# Patient Record
Sex: Female | Born: 1990 | Race: Black or African American | Hispanic: No | Marital: Single | Smoking: Former smoker
Health system: Southern US, Community
[De-identification: ages and names within clinical notes are randomized; demographics above are authoritative.]

## PROBLEM LIST (undated history)

## (undated) DIAGNOSIS — J020 Streptococcal pharyngitis: Secondary | ICD-10-CM

## (undated) DIAGNOSIS — J45909 Unspecified asthma, uncomplicated: Secondary | ICD-10-CM

---
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
## 2011-03-18 ENCOUNTER — Emergency Department (HOSPITAL_COMMUNITY)
Admission: EM | Admit: 2011-03-18 | Discharge: 2011-03-18 | Disposition: A | Discharge status: Home / Self Care | Payer: Self-pay | Attending: Emergency Medicine | Admitting: Emergency Medicine

## 2011-03-18 ENCOUNTER — Emergency Department (HOSPITAL_COMMUNITY): Payer: Self-pay

## 2011-03-18 DIAGNOSIS — R0989 Other specified symptoms and signs involving the circulatory and respiratory systems: Secondary | ICD-10-CM | POA: Insufficient documentation

## 2011-03-18 DIAGNOSIS — R0609 Other forms of dyspnea: Secondary | ICD-10-CM | POA: Insufficient documentation

## 2011-03-18 DIAGNOSIS — J45901 Unspecified asthma with (acute) exacerbation: Secondary | ICD-10-CM | POA: Insufficient documentation

## 2011-04-05 LAB — POCT RAPID STREP A: Streptococcus, Group A Screen (Direct): NEGATIVE

## 2011-04-09 LAB — URINE MICROSCOPIC-ADD ON

## 2011-04-09 LAB — URINALYSIS, ROUTINE W REFLEX MICROSCOPIC
Hgb urine dipstick: NEGATIVE
Nitrite: NEGATIVE
Urobilinogen, UA: 1
pH: 7.5

## 2011-04-09 LAB — PREGNANCY, URINE: Preg Test, Ur: NEGATIVE

## 2012-01-31 ENCOUNTER — Emergency Department (HOSPITAL_COMMUNITY)
Admission: EM | Admit: 2012-01-31 | Discharge: 2012-02-01 | Disposition: A | Discharge status: Home / Self Care | Payer: PRIVATE HEALTH INSURANCE | Attending: Emergency Medicine | Admitting: Emergency Medicine

## 2012-01-31 ENCOUNTER — Encounter (HOSPITAL_COMMUNITY): Payer: Self-pay | Admitting: *Deleted

## 2012-01-31 ENCOUNTER — Emergency Department (INDEPENDENT_AMBULATORY_CARE_PROVIDER_SITE_OTHER)
Admission: EM | Admit: 2012-01-31 | Discharge: 2012-01-31 | Disposition: A | Discharge status: Nursing Home (Includes ICF) | Payer: PRIVATE HEALTH INSURANCE | Source: Home / Self Care | Attending: Emergency Medicine | Admitting: Emergency Medicine

## 2012-01-31 ENCOUNTER — Emergency Department (HOSPITAL_COMMUNITY): Payer: PRIVATE HEALTH INSURANCE

## 2012-01-31 DIAGNOSIS — S0510XA Contusion of eyeball and orbital tissues, unspecified eye, initial encounter: Secondary | ICD-10-CM | POA: Insufficient documentation

## 2012-01-31 DIAGNOSIS — S0083XA Contusion of other part of head, initial encounter: Secondary | ICD-10-CM

## 2012-01-31 DIAGNOSIS — J45909 Unspecified asthma, uncomplicated: Secondary | ICD-10-CM | POA: Insufficient documentation

## 2012-01-31 DIAGNOSIS — H1131 Conjunctival hemorrhage, right eye: Secondary | ICD-10-CM

## 2012-01-31 DIAGNOSIS — S0003XA Contusion of scalp, initial encounter: Secondary | ICD-10-CM | POA: Insufficient documentation

## 2012-01-31 DIAGNOSIS — H1130 Conjunctival hemorrhage, unspecified eye: Secondary | ICD-10-CM | POA: Insufficient documentation

## 2012-01-31 DIAGNOSIS — Y92009 Unspecified place in unspecified non-institutional (private) residence as the place of occurrence of the external cause: Secondary | ICD-10-CM | POA: Insufficient documentation

## 2012-01-31 DIAGNOSIS — S0993XA Unspecified injury of face, initial encounter: Secondary | ICD-10-CM

## 2012-01-31 HISTORY — DX: Unspecified asthma, uncomplicated: J45.909

## 2012-01-31 MED ORDER — OXYCODONE-ACETAMINOPHEN 5-325 MG PO TABS
2.0000 | ORAL_TABLET | Freq: Once | ORAL | Status: AC
  Administered 2012-01-31: 2 via ORAL
  Filled 2012-01-31: qty 2

## 2012-01-31 NOTE — ED Notes (Signed)
Pt awaiting transfer to [facility]

## 2012-01-31 NOTE — ED Notes (Signed)
Pt returned from CT °
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
## 2012-02-01 MED ORDER — OXYCODONE-ACETAMINOPHEN 5-325 MG PO TABS: 1.0000 | ORAL_TABLET | Freq: Four times a day (QID) | ORAL | Status: AC | PRN

## 2012-02-04 NOTE — ED Provider Notes (Signed)
Medical screening examination/treatment/procedure(s) were performed by non-physician practitioner and as supervising physician I was immediately available for consultation/collaboration.   Lorene Samaan Y. Sherae Santino, MD 02/04/12 1721 

## 2012-02-05 ENCOUNTER — Emergency Department (HOSPITAL_COMMUNITY)
Admission: EM | Admit: 2012-02-05 | Discharge: 2012-02-05 | Disposition: A | Discharge status: Home / Self Care | Payer: PRIVATE HEALTH INSURANCE | Attending: Emergency Medicine | Admitting: Emergency Medicine

## 2012-02-05 DIAGNOSIS — R51 Headache: Secondary | ICD-10-CM | POA: Insufficient documentation
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
## 2012-02-05 NOTE — ED Notes (Signed)
Patient discharge to home with written and verbal instructions. Respirations equal and unlabored, Skin warm and dry. No acute distress noted.

## 2012-11-14 ENCOUNTER — Other Ambulatory Visit: Payer: Self-pay

## 2012-11-14 ENCOUNTER — Emergency Department (HOSPITAL_COMMUNITY): Payer: Self-pay

## 2012-11-14 ENCOUNTER — Emergency Department (HOSPITAL_COMMUNITY)
Admission: EM | Admit: 2012-11-14 | Discharge: 2012-11-15 | Disposition: A | Discharge status: Home / Self Care | Payer: Self-pay | Attending: Emergency Medicine | Admitting: Emergency Medicine

## 2012-11-14 DIAGNOSIS — R05 Cough: Secondary | ICD-10-CM | POA: Insufficient documentation

## 2012-11-14 DIAGNOSIS — J019 Acute sinusitis, unspecified: Secondary | ICD-10-CM | POA: Insufficient documentation

## 2012-11-14 DIAGNOSIS — J069 Acute upper respiratory infection, unspecified: Secondary | ICD-10-CM | POA: Insufficient documentation

## 2012-11-14 DIAGNOSIS — R509 Fever, unspecified: Secondary | ICD-10-CM | POA: Insufficient documentation

## 2012-11-14 DIAGNOSIS — J3489 Other specified disorders of nose and nasal sinuses: Secondary | ICD-10-CM | POA: Insufficient documentation

## 2012-11-14 DIAGNOSIS — R059 Cough, unspecified: Secondary | ICD-10-CM | POA: Insufficient documentation

## 2012-11-14 DIAGNOSIS — J45909 Unspecified asthma, uncomplicated: Secondary | ICD-10-CM | POA: Insufficient documentation

## 2012-11-14 NOTE — ED Provider Notes (Signed)
History    This chart was scribed for Magnus Sinning, PA working with Gerhard Munch, MD by ED Scribe, Burman Nieves. This patient was seen in room WTR5/WTR5 and the patient's care was started at 10:42 PM.   CSN: [id number]  Arrival date & time 11/14/12  2212   First MD Initiated Contact with Patient 11/14/12 2242      Chief Complaint  Patient presents with  . Hoarse    (Consider location/radiation/quality/duration/timing/severity/associated sxs/prior treatment) The history is provided by the patient. No language interpreter was used.   HPI Comments: Wendy Morton is a 22 y.o. female who presents to the Emergency Department complaining of moderate constant sore throat, nasal congestion,productive cough, and sinus pressure which started a week ago but has progressively gotten worse today. She states she has taken alka seltzer with no immediate relief. Pt also has had some moderate constant chest tightness for the past week.  Tightness primarily with coughing.  She denies SOB or wheezing.   Pt states that she had a fever 101 today. Pt denies  nausea, vomiting, diarrhea, and any other associated symptoms.  She does not smoke.  She does have a history of Asthma.    Past Medical History  Diagnosis Date  . Asthma     No past surgical history on file.  No family history on file.  History  Substance Use Topics  . Smoking status: Never Smoker   . Smokeless tobacco: Never Used  . Alcohol Use: Yes     Comment: occasional per pt    OB History   Grav Para Term Preterm Abortions TAB SAB Ect Mult Living                  Review of Systems  HENT: Positive for sore throat and voice change.   All other systems reviewed and are negative.    Allergies  Review of patient's allergies indicates no known allergies.  Home Medications   Current Outpatient Rx  Name  Route  Sig  Dispense  Refill  . Chlorphen-Phenyleph-ASA (ALKA-SELTZER PLUS COLD PO)   Oral   Take 1 capsule by mouth  daily.           BP 122/76  Pulse 90  Temp(Src) 98.2 F (36.8 C) (Oral)  Resp 15  SpO2 99%  Physical Exam  Nursing note and vitals reviewed. Constitutional: She appears well-developed and well-nourished.  HENT:  Head: Normocephalic and atraumatic. No trismus in the jaw.  Right Ear: Tympanic membrane, external ear and ear canal normal.  Left Ear: Tympanic membrane, external ear and ear canal normal.  Nose: Mucosal edema and rhinorrhea present. Right sinus exhibits maxillary sinus tenderness. Right sinus exhibits no frontal sinus tenderness. Left sinus exhibits maxillary sinus tenderness. Left sinus exhibits no frontal sinus tenderness.  Mouth/Throat: Uvula is midline. Oropharyngeal exudate, posterior oropharyngeal edema and posterior oropharyngeal erythema present. No tonsillar abscesses.  Handling secretions well  Eyes: EOM are normal. Pupils are equal, round, and reactive to light.  Neck: Normal range of motion. Neck supple.  Cardiovascular: Normal rate, regular rhythm and normal heart sounds.   Pulmonary/Chest: Effort normal and breath sounds normal. No respiratory distress. She has no wheezes. She has no rales.  Musculoskeletal: Normal range of motion.  Neurological: She is alert.  Skin: Skin is warm and dry.  Psychiatric: She has a normal mood and affect. Her behavior is normal.    ED Course  Procedures (including critical care time) DIAGNOSTIC STUDIES: Oxygen Saturation is  99% on room air, normal by my interpretation.    COORDINATION OF CARE: 11:11 PM Discussed ED treatment with pt and pt agrees.     Labs Reviewed - No data to display Dg Chest 2 View  11/14/2012  *RADIOLOGY REPORT*  Clinical Data: Cough, fever, shortness of breath, chest pain  CHEST - 2 VIEW  Comparison: 03/18/2011  Findings: Normal heart size, mediastinal contours, and pulmonary vascularity. Minimal peribronchial thickening. No pulmonary infiltrate, pleural effusion or pneumothorax. Levoconvex  thoracic scoliosis. No acute osseous findings.  IMPRESSION: Minimal bronchitic changes. No acute infiltrate.   Original Report Authenticated By: Ulyses Southward, M.D.      1. URI (upper respiratory infection)   2. Acute sinus infection      Date: 11/15/2012  Rate: 94  Rhythm: normal sinus rhythm  QRS Axis: normal  Intervals: normal  ST/T Wave abnormalities: normal  Conduction Disutrbances:none  Narrative Interpretation:   Old EKG Reviewed: none available    MDM  Pt CXR negative for acute infiltrate. Patient also with symptoms consistent with Acute Sinusitis.  Patient discharged home with antibiotic.  Rapid strep negative.  No signs of peritonsillar abscess.  Pt is hemodynamically stable & in NAD prior to dc.  I personally performed the services described in this documentation, which was scribed in my presence. The recorded information has been reviewed and is accurate.    Pascal Lux [place], PA-C 11/16/12 1350

## 2012-11-14 NOTE — ED Notes (Signed)
Pt present with c/o drainage to throat for a week.  Pt reports taking otc meds w/o relief.  Pt present with hoarseness.  Pt reprots hx asthma.  Pt reports chest pain x1 week.  Worsening today.

## 2012-11-15 LAB — RAPID STREP SCREEN (MED CTR MEBANE ONLY): Streptococcus, Group A Screen (Direct): NEGATIVE

## 2012-11-15 MED ORDER — AZITHROMYCIN 250 MG PO TABS: 250.0000 mg | ORAL_TABLET | Freq: Every day | ORAL | Status: DC

## 2012-11-15 MED ORDER — PREDNISONE 20 MG PO TABS
60.0000 mg | ORAL_TABLET | Freq: Once | ORAL | Status: AC
  Administered 2012-11-15: 60 mg via ORAL
  Filled 2012-11-15: qty 3

## 2012-11-16 NOTE — ED Provider Notes (Signed)
  Medical screening examination/treatment/procedure(s) were performed by non-physician practitioner and as supervising physician I was immediately available for consultation/collaboration.    Trysten Berti, MD 11/16/12 1555 

## 2012-11-17 LAB — STREP A DNA PROBE: Group A Strep Probe: NEGATIVE

## 2013-04-09 ENCOUNTER — Ambulatory Visit (INDEPENDENT_AMBULATORY_CARE_PROVIDER_SITE_OTHER): Payer: BC Managed Care – PPO | Admitting: Family Medicine

## 2013-04-09 VITALS — BP 100/60 | HR 71 | Temp 97.7°F | Resp 16 | Ht 66.5 in | Wt 244.0 lb

## 2013-04-09 DIAGNOSIS — N760 Acute vaginitis: Secondary | ICD-10-CM

## 2013-04-09 DIAGNOSIS — N898 Other specified noninflammatory disorders of vagina: Secondary | ICD-10-CM

## 2013-04-09 DIAGNOSIS — B9689 Other specified bacterial agents as the cause of diseases classified elsewhere: Secondary | ICD-10-CM

## 2013-04-09 DIAGNOSIS — L304 Erythema intertrigo: Secondary | ICD-10-CM

## 2013-04-09 DIAGNOSIS — Z01419 Encounter for gynecological examination (general) (routine) without abnormal findings: Secondary | ICD-10-CM

## 2013-04-09 DIAGNOSIS — L538 Other specified erythematous conditions: Secondary | ICD-10-CM

## 2013-04-09 LAB — POCT WET PREP WITH KOH
Trichomonas, UA: NEGATIVE
Yeast Wet Prep HPF POC: NEGATIVE

## 2013-04-09 MED ORDER — METRONIDAZOLE 0.75 % VA GEL: 1.0000 | Freq: Every day | VAGINAL | Status: DC

## 2013-04-09 MED ORDER — CLOTRIMAZOLE-BETAMETHASONE 1-0.05 % EX CREA: TOPICAL_CREAM | Freq: Two times a day (BID) | CUTANEOUS | Status: DC | PRN

## 2013-04-09 NOTE — Progress Notes (Signed)
Subjective:    Patient ID: Wendy Morton, female    DOB: 1990/08/31, 22 y.o.   MRN: [id number] Chief Complaint  Patient presents with  . Vaginal Discharge    2-3 weeks     HPI  Sexualy active with women - no prior h/o STD testing received.  About 3 weeks ago was diagnosed with BV, finished course of metronidazole 500mg  bid x 1 wk but sxs never resolved. She then had her period but discharge continued.  Past Medical History  Diagnosis Date  . Asthma   . Strep throat    No current outpatient prescriptions on file prior to visit.   No current facility-administered medications on file prior to visit.   No Known Allergies  Review of Systems  Constitutional: Negative for fever, chills, diaphoresis, activity change, appetite change, fatigue and unexpected weight change.  Gastrointestinal: Negative for abdominal pain, diarrhea, constipation, blood in stool, anal bleeding and rectal pain.  Genitourinary: Positive for vaginal discharge. Negative for dysuria, urgency, frequency, hematuria, decreased urine volume, vaginal bleeding, difficulty urinating, genital sores, vaginal pain, menstrual problem, pelvic pain and dyspareunia.  Musculoskeletal: Negative for gait problem.  Skin: Negative for rash.  Hematological: Negative for adenopathy.  Psychiatric/Behavioral: The patient is not nervous/anxious.       BP 100/60  Pulse 71  Temp(Src) 97.7 F (36.5 C) (Oral)  Resp 16  Ht 5' 6.5" (1.689 m)  Wt 244 lb (110.678 kg)  BMI 38.8 kg/m2  SpO2 97%  LMP 04/01/2013  Objective:   Physical Exam  Constitutional: She is oriented to person, place, and time. She appears well-developed and well-nourished. No distress.  HENT:  Head: Normocephalic and atraumatic.  Cardiovascular: Normal rate, regular rhythm, normal heart sounds and intact distal pulses.   Pulmonary/Chest: Effort normal and breath sounds normal.  Abdominal: Soft. Bowel sounds are normal. She exhibits no distension. There is no  tenderness. There is no rebound and no guarding.  Genitourinary: Uterus normal. Pelvic exam was performed with patient supine. There is rash on the right labia. There is no tenderness or lesion on the right labia. There is rash on the left labia. There is no tenderness or lesion on the left labia. Cervix exhibits no motion tenderness and no friability. Right adnexum displays no mass, no tenderness and no fullness. Left adnexum displays no mass, no tenderness and no fullness. No erythema or tenderness around the vagina. Vaginal discharge found.  Lymphadenopathy:       Right: No inguinal adenopathy present.       Left: No inguinal adenopathy present.  Neurological: She is alert and oriented to person, place, and time.  Skin: Skin is warm and dry. She is not diaphoretic.  Psychiatric: She has a normal mood and affect. Her behavior is normal.          Results for orders placed in visit on 04/09/13  POCT WET PREP WITH KOH      Result Value Range   Trichomonas, UA Negative     Clue Cells Wet Prep HPF POC tntc     Epithelial Wet Prep HPF POC tntc     Yeast Wet Prep HPF POC neg     Bacteria Wet Prep HPF POC 3+     RBC Wet Prep HPF POC neg     WBC Wet Prep HPF POC tntc     KOH Prep POC Negative      Assessment & Plan:   Encounter for routine gynecological examination - Plan: POCT Wet Prep  with KOH, Pap IG, CT/NG w/ reflex HPV when ASC-U, CANCELED: Pap IG, CT/NG w/ reflex HPV when ASC-U  Vaginal discharge - Plan: POCT Wet Prep with KOH, Pap IG, CT/NG w/ reflex HPV when ASC-U, CANCELED: Pap IG, CT/NG w/ reflex HPV when ASC-U  Bacterial vaginosis - Plan: Pap IG, CT/NG w/ reflex HPV when ASC-U  Eczema intertrigo - Plan: Pap IG, CT/NG w/ reflex HPV when ASC-U  Meds ordered this encounter  Medications  . metroNIDAZOLE (METROGEL VAGINAL) 0.75 % vaginal gel    Sig: Place 1 Applicatorful vaginally at bedtime. X 5d    Dispense:  70 g    Refill:  0  . clotrimazole-betamethasone (LOTRISONE)  cream    Sig: Apply topically 2 (two) times daily as needed.    Dispense:  45 g    Refill:  0   Norberto Sorenson, MD MPH

## 2013-04-09 NOTE — Patient Instructions (Signed)
Bacterial Vaginosis Bacterial vaginosis (BV) is a vaginal infection where the normal balance of bacteria in the vagina is disrupted. The normal balance is then replaced by an overgrowth of certain bacteria. There are several different kinds of bacteria that can cause BV. BV is the most common vaginal infection in women of childbearing age. CAUSES   The cause of BV is not fully understood. BV develops when there is an increase or imbalance of harmful bacteria.  Some activities or behaviors can upset the normal balance of bacteria in the vagina and put women at increased risk including:  Having a new sex partner or multiple sex partners.  Douching.  Using an intrauterine device (IUD) for contraception.  It is not clear what role sexual activity plays in the development of BV. However, women that have never had sexual intercourse are rarely infected with BV. Women do not get BV from toilet seats, bedding, swimming pools or from touching objects around them.  SYMPTOMS   Grey vaginal discharge.  A fish-like odor with discharge, especially after sexual intercourse.  Itching or burning of the vagina and vulva.  Burning or pain with urination.  Some women have no signs or symptoms at all. DIAGNOSIS  Your caregiver must examine the vagina for signs of BV. Your caregiver will perform lab tests and look at the sample of vaginal fluid through a microscope. They will look for bacteria and abnormal cells (clue cells), a pH test higher than 4.5, and a positive amine test all associated with BV.  RISKS AND COMPLICATIONS   Pelvic inflammatory disease (PID).  Infections following gynecology surgery.  Developing HIV.  Developing herpes virus. TREATMENT  Sometimes BV will clear up without treatment. However, all women with symptoms of BV should be treated to avoid complications, especially if gynecology surgery is planned. Female partners generally do not need to be treated. However, BV may spread  between female sex partners so treatment is helpful in preventing a recurrence of BV.   BV may be treated with antibiotics. The antibiotics come in either pill or vaginal cream forms. Either can be used with nonpregnant or pregnant women, but the recommended dosages differ. These antibiotics are not harmful to the baby.  BV can recur after treatment. If this happens, a second round of antibiotics will often be prescribed.  Treatment is important for pregnant women. If not treated, BV can cause a premature delivery, especially for a pregnant woman who had a premature birth in the past. All pregnant women who have symptoms of BV should be checked and treated.  For chronic reoccurrence of BV, treatment with a type of prescribed gel vaginally twice a week is helpful. HOME CARE INSTRUCTIONS   Finish all medication as directed by your caregiver.  Do not have sex until treatment is completed.  Tell your sexual partner that you have a vaginal infection. They should see their caregiver and be treated if they have problems, such as a mild rash or itching.  Practice safe sex. Use condoms. Only have 1 sex partner. PREVENTION  Basic prevention steps can help reduce the risk of upsetting the natural balance of bacteria in the vagina and developing BV:  Do not have sexual intercourse (be abstinent).  Do not douche.  Use all of the medicine prescribed for treatment of BV, even if the signs and symptoms go away.  Tell your sex partner if you have BV. That way, they can be treated, if needed, to prevent reoccurrence. SEEK MEDICAL CARE IF:     Your symptoms are not improving after 3 days of treatment.  You have increased discharge, pain, or fever. MAKE SURE YOU:   Understand these instructions.  Will watch your condition.  Will get help right away if you are not doing well or get worse. FOR MORE INFORMATION  Division of STD Prevention (DSTDP), Centers for Disease Control and Prevention:  www.cdc.gov/std American [employer] Association (ASHA): www.ashastd.org  Document Released: 06/25/2005 Document Revised: 09/17/2011 Document Reviewed: 12/16/2008 Exit[employer] Patient Information 2014 [employer].  

## 2013-04-10 LAB — PAP IG, CT-NG, RFX HPV ASCU

## 2013-04-13 ENCOUNTER — Telehealth: Payer: Self-pay

## 2013-04-13 ENCOUNTER — Encounter: Payer: Self-pay | Admitting: Family Medicine

## 2013-05-07 NOTE — Telephone Encounter (Deleted)
Do not need to record anything on this patient

## 2013-05-07 NOTE — Telephone Encounter (Deleted)
xxxx 

## 2013-05-14 ENCOUNTER — Other Ambulatory Visit: Payer: Self-pay

## 2013-05-25 ENCOUNTER — Encounter (HOSPITAL_COMMUNITY): Payer: Self-pay | Admitting: Emergency Medicine

## 2013-05-25 ENCOUNTER — Emergency Department (HOSPITAL_COMMUNITY)
Admission: EM | Admit: 2013-05-25 | Discharge: 2013-05-25 | Disposition: A | Discharge status: Home / Self Care | Payer: BC Managed Care – PPO | Attending: Emergency Medicine | Admitting: Emergency Medicine

## 2013-05-25 DIAGNOSIS — H9209 Otalgia, unspecified ear: Secondary | ICD-10-CM | POA: Insufficient documentation

## 2013-05-25 DIAGNOSIS — R509 Fever, unspecified: Secondary | ICD-10-CM | POA: Insufficient documentation

## 2013-05-25 DIAGNOSIS — J020 Streptococcal pharyngitis: Secondary | ICD-10-CM

## 2013-05-25 DIAGNOSIS — R000 Tachycardia, unspecified: Secondary | ICD-10-CM | POA: Insufficient documentation

## 2013-05-25 DIAGNOSIS — R079 Chest pain, unspecified: Secondary | ICD-10-CM | POA: Insufficient documentation

## 2013-05-25 DIAGNOSIS — J029 Acute pharyngitis, unspecified: Secondary | ICD-10-CM | POA: Insufficient documentation

## 2013-05-25 DIAGNOSIS — J45909 Unspecified asthma, uncomplicated: Secondary | ICD-10-CM | POA: Insufficient documentation

## 2013-05-25 LAB — BASIC METABOLIC PANEL
BUN: 7 mg/dL (ref 6–23)
Calcium: 9.4 mg/dL (ref 8.4–10.5)
Creatinine, Ser: 0.7 mg/dL (ref 0.50–1.10)
GFR calc Af Amer: >90 mL/min (ref >90)
GFR calc non Af Amer: >90 mL/min (ref >90)
Glucose, Bld: 97 mg/dL (ref 70–99)

## 2013-05-25 LAB — CBC WITH DIFFERENTIAL/PLATELET
Hemoglobin: 13.3 g/dL (ref 12.0–15.0)
RBC: 4.88 MIL/uL (ref 3.87–5.11)
WBC: 16.4 10*3/uL — ABNORMAL HIGH (ref 4.0–10.5)

## 2013-05-25 LAB — RAPID STREP SCREEN (MED CTR MEBANE ONLY): Streptococcus, Group A Screen (Direct): POSITIVE — AB

## 2013-05-25 MED ORDER — ACETAMINOPHEN 325 MG PO TABS
650.0000 mg | ORAL_TABLET | Freq: Once | ORAL | Status: AC
  Administered 2013-05-25: 650 mg via ORAL
  Filled 2013-05-25: qty 2

## 2013-05-25 MED ORDER — PENICILLIN G BENZATHINE 1200000 UNIT/2ML IM SUSP
1.2000 10*6.[IU] | Freq: Once | INTRAMUSCULAR | Status: AC
  Administered 2013-05-25: 1.2 10*6.[IU] via INTRAMUSCULAR
  Filled 2013-05-25: qty 2

## 2013-05-25 NOTE — ED Notes (Signed)
Pt here with multiple complaints. C/o of headache, ear pain, chest tightness x2 days. Pain 10/10 generalized. States she feels like she has the flu.

## 2013-05-25 NOTE — ED Provider Notes (Signed)
CSN: [id number]     Arrival date & time 05/25/13  1210 History   First MD Initiated Contact with Patient 05/25/13 1606     Chief Complaint  Patient presents with  . Headache  . Otalgia  . Chest Pain   (Consider location/radiation/quality/duration/timing/severity/associated sxs/prior Treatment) Patient is a 22 y.o. female presenting with headaches, ear pain, and chest pain. The history is provided by the patient.  Headache Pain location:  Generalized Quality:  Dull Radiates to:  Does not radiate Onset quality:  Gradual Duration:  2 days Timing:  Constant Progression:  Unchanged Chronicity:  New Similar to prior headaches: yes   Context: not exposure to bright light   Relieved by:  Nothing Worsened by:  Nothing tried Ineffective treatments:  Acetaminophen Associated symptoms: ear pain, fever, sore throat and URI   Associated symptoms: no back pain, no cough, no diarrhea, no nausea, no neck pain, no neck stiffness and no photophobia   Otalgia Associated symptoms: fever, headaches and sore throat   Associated symptoms: no cough, no diarrhea and no neck pain   Chest Pain Associated symptoms: fever and headache   Associated symptoms: no back pain, no cough and no nausea     Past Medical History  Diagnosis Date  . Asthma    History reviewed. No pertinent past surgical history. Family History  Problem Relation Age of Onset  . Cancer Mother    History  Substance Use Topics  . Smoking status: Never Smoker   . Smokeless tobacco: Never Used  . Alcohol Use: Yes     Comment: occasional per pt   OB History   Grav Para Term Preterm Abortions TAB SAB Ect Mult Living                 Review of Systems  Constitutional: Positive for fever.  HENT: Positive for ear pain and sore throat.   Eyes: Negative for photophobia.  Respiratory: Negative for cough.   Cardiovascular: Positive for chest pain.  Gastrointestinal: Negative for nausea and diarrhea.  Musculoskeletal: Negative  for back pain, neck pain and neck stiffness.  Neurological: Positive for headaches.  All other systems reviewed and are negative.    Allergies  Review of patient's allergies indicates no known allergies.  Home Medications  No current outpatient prescriptions on file. BP 95/60  Pulse 117  Temp(Src) 100.7 F (38.2 C) (Oral)  Resp 18  SpO2 100% Physical Exam  Nursing note and vitals reviewed. Constitutional: She is oriented to person, place, and time. She appears well-developed and well-nourished.  Non-toxic appearance. No distress.  HENT:  Head: Normocephalic and atraumatic.  Mouth/Throat: Posterior oropharyngeal edema and posterior oropharyngeal erythema present.  Eyes: Conjunctivae, EOM and lids are normal. Pupils are equal, round, and reactive to light.  Neck: Normal range of motion. Neck supple. No rigidity. No tracheal deviation and normal range of motion present. No mass present.  Cardiovascular: Regular rhythm and normal heart sounds.  Tachycardia present.  Exam reveals no gallop.   No murmur heard. Pulmonary/Chest: Effort normal and breath sounds normal. No stridor. No respiratory distress. She has no decreased breath sounds. She has no wheezes. She has no rhonchi. She has no rales.  Abdominal: Soft. Normal appearance and bowel sounds are normal. She exhibits no distension. There is no tenderness. There is no rebound and no CVA tenderness.  Musculoskeletal: Normal range of motion. She exhibits no edema and no tenderness.  Neurological: She is alert and oriented to person, place, and time. She has  normal strength. No cranial nerve deficit or sensory deficit. GCS eye subscore is 4. GCS verbal subscore is 5. GCS motor subscore is 6.  Skin: Skin is warm and dry. No abrasion and no rash noted.  Psychiatric: She has a normal mood and affect. Her speech is normal and behavior is normal.    ED Course  Procedures (including critical care time) Labs Review Labs Reviewed  CBC WITH  DIFFERENTIAL - Abnormal; Notable for the following:    WBC 16.4 (*)    Neutrophils Relative % 90 (*)    Neutro Abs 14.7 (*)    Lymphocytes Relative 5 (*)    All other components within normal limits  BASIC METABOLIC PANEL   Imaging Review No results found.  EKG Interpretation   None       MDM  No diagnosis found. Patient given Bicillin and Tylenol here. Will followup with her Dr. as needed    Toy Baker, MD 05/25/13 [phone]

## 2013-06-05 ENCOUNTER — Encounter (HOSPITAL_COMMUNITY): Payer: Self-pay | Admitting: Emergency Medicine

## 2013-06-05 ENCOUNTER — Emergency Department (HOSPITAL_COMMUNITY)
Admission: EM | Admit: 2013-06-05 | Discharge: 2013-06-05 | Disposition: A | Discharge status: Home / Self Care | Payer: BC Managed Care – PPO | Attending: Emergency Medicine | Admitting: Emergency Medicine

## 2013-06-05 DIAGNOSIS — B3731 Acute candidiasis of vulva and vagina: Secondary | ICD-10-CM | POA: Insufficient documentation

## 2013-06-05 DIAGNOSIS — B373 Candidiasis of vulva and vagina: Secondary | ICD-10-CM

## 2013-06-05 DIAGNOSIS — J45909 Unspecified asthma, uncomplicated: Secondary | ICD-10-CM | POA: Insufficient documentation

## 2013-06-05 HISTORY — DX: Streptococcal pharyngitis: J02.0

## 2013-06-05 LAB — WET PREP, GENITAL: Trich, Wet Prep: NONE SEEN

## 2013-06-05 LAB — URINALYSIS, ROUTINE W REFLEX MICROSCOPIC
Bilirubin Urine: NEGATIVE
Ketones, ur: NEGATIVE mg/dL
Protein, ur: NEGATIVE mg/dL
Specific Gravity, Urine: 1.03 (ref 1.005–1.030)
Urobilinogen, UA: 0.2 mg/dL (ref 0.0–1.0)
pH: 6 (ref 5.0–8.0)

## 2013-06-05 LAB — URINE MICROSCOPIC-ADD ON

## 2013-06-05 MED ORDER — FLUCONAZOLE 150 MG PO TABS
150.0000 mg | ORAL_TABLET | Freq: Once | ORAL | Status: AC
  Administered 2013-06-05: 150 mg via ORAL
  Filled 2013-06-05: qty 1

## 2013-06-05 NOTE — ED Notes (Signed)
Patient reports that she has a history of vaginal bacterial vaginosis when she takes antibiotics. Patient reports that she had Penicillin a week ago. Patient c/o vaginal itching and small amount of light yellow/white drainage.

## 2013-06-05 NOTE — ED Provider Notes (Signed)
Medical screening examination/treatment/procedure(s) were performed by non-physician practitioner and as supervising physician I was immediately available for consultation/collaboration.  EKG Interpretation   None        Ethelda Chick, MD 06/05/13 2007

## 2013-06-05 NOTE — ED Provider Notes (Signed)
CSN: [id number]     Arrival date & time 06/05/13  1717 History   First MD Initiated Contact with Patient 06/05/13 1832     Chief Complaint  Patient presents with  . Vaginal Itching   (Consider location/radiation/quality/duration/timing/severity/associated sxs/prior Treatment) HPI  22 year old female with history of bacterial vaginosis in the past here presents complaining of vaginal discomfort. Patient reports last week she was diagnosed with strep throat and received a shot of penicillin. For the past 2-3 days she has noticed mild vaginal discomfort and soreness. States she noticed a small amount of yellow-white vaginal dryness and discharge along with vaginal itching. Her symptoms felt similar to prior bacterial vaginosis. She denies any fever, chills, chest pain, shortness of breath, abdominal pain, dysuria, hematuria, or rash. Report mild low back pain that is intermittent and does not bother her. Patient has a remote history of Chlamydia infection. She is sexually active. She does not douche. She is G0P0.    Past Medical History  Diagnosis Date  . Asthma   . Strep throat    History reviewed. No pertinent past surgical history. Family History  Problem Relation Age of Onset  . Cancer Mother    History  Substance Use Topics  . Smoking status: Never Smoker   . Smokeless tobacco: Never Used  . Alcohol Use: Yes     Comment: occasional per pt   OB History   Grav Para Term Preterm Abortions TAB SAB Ect Mult Living                 Review of Systems  All other systems reviewed and are negative.    Allergies  Review of patient's allergies indicates no known allergies.  Home Medications  No current outpatient prescriptions on file. BP 125/71  Pulse 97  Temp(Src) 98.3 F (36.8 C) (Oral)  Resp 20  SpO2 99%  LMP 05/27/2013 Physical Exam  Nursing note and vitals reviewed. Constitutional: She appears well-developed and well-nourished. No distress.  HENT:  Head:  Normocephalic and atraumatic.  Eyes: Conjunctivae are normal.  Neck: Normal range of motion. Neck supple.  Cardiovascular: Normal rate and regular rhythm.   Pulmonary/Chest: Effort normal and breath sounds normal. She exhibits no tenderness.  Abdominal: Soft. There is no tenderness.  Genitourinary: Vagina normal and uterus normal. There is no rash or lesion on the right labia. There is no rash or lesion on the left labia. Cervix exhibits no motion tenderness and no discharge. Right adnexum displays no mass and no tenderness. Left adnexum displays no mass and no tenderness. No erythema, tenderness or bleeding around the vagina. No vaginal discharge found.  Chaperone present:  Moderate white curd-like vaginal discharge.  No adnexal tenderness, no cervical motion tenderness.    Lymphadenopathy:       Right: No inguinal adenopathy present.       Left: No inguinal adenopathy present.    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 Will perform pelvic exam.  i suspect vaginal candidiasis considering recent abx use.    8:00 PM Patient's wet prep shows evidence of yeast infection. Patient has non-complicated vaginal candidiasis. Will give fluconazole 150 mg by mouth once. Patient otherwise stable for discharge. No significant evidence suggestive of BV. Gonorrhea and Chlamydia culture sent, patient will be notified if positive.   Labs Review Labs Reviewed  WET PREP, GENITAL - Abnormal; Notable for the following:  Yeast Wet Prep HPF POC MODERATE (*)    Clue Cells Wet Prep HPF POC FEW (*)    WBC, Wet Prep HPF POC FEW (*)    All other components within normal limits  URINALYSIS, ROUTINE W REFLEX MICROSCOPIC - Abnormal; Notable for the following:    Leukocytes, UA MODERATE (*)    All other components within normal limits  URINE MICROSCOPIC-ADD ON - Abnormal; Notable for the  following:    Squamous Epithelial / LPF FEW (*)    All other components within normal limits  GC/CHLAMYDIA PROBE AMP  POCT PREGNANCY, URINE   Imaging Review No results found.  EKG Interpretation   None       MDM   1. Vaginal candidiasis    BP 125/71  Pulse 97  Temp(Src) 98.3 F (36.8 C) (Oral)  Resp 20  SpO2 99%  LMP 05/27/2013     Fayrene Helper, PA-C 06/05/13 2002

## 2013-06-06 LAB — GC/CHLAMYDIA PROBE AMP: CT Probe RNA: NEGATIVE

## 2013-08-29 ENCOUNTER — Encounter (HOSPITAL_COMMUNITY): Payer: Self-pay | Admitting: Emergency Medicine

## 2013-08-29 ENCOUNTER — Emergency Department (HOSPITAL_COMMUNITY)
Admission: EM | Admit: 2013-08-29 | Discharge: 2013-08-29 | Disposition: A | Discharge status: Home / Self Care | Payer: BC Managed Care – PPO | Attending: Emergency Medicine | Admitting: Emergency Medicine

## 2013-08-29 DIAGNOSIS — J45909 Unspecified asthma, uncomplicated: Secondary | ICD-10-CM | POA: Insufficient documentation

## 2013-08-29 DIAGNOSIS — B373 Candidiasis of vulva and vagina: Secondary | ICD-10-CM | POA: Insufficient documentation

## 2013-08-29 DIAGNOSIS — Z3202 Encounter for pregnancy test, result negative: Secondary | ICD-10-CM | POA: Insufficient documentation

## 2013-08-29 DIAGNOSIS — B3731 Acute candidiasis of vulva and vagina: Secondary | ICD-10-CM | POA: Insufficient documentation

## 2013-08-29 LAB — WET PREP, GENITAL
CLUE CELLS WET PREP: NONE SEEN
Trich, Wet Prep: NONE SEEN
Yeast Wet Prep HPF POC: NONE SEEN

## 2013-08-29 LAB — URINE MICROSCOPIC-ADD ON

## 2013-08-29 LAB — URINALYSIS, ROUTINE W REFLEX MICROSCOPIC
Bilirubin Urine: NEGATIVE
Glucose, UA: NEGATIVE mg/dL
Hgb urine dipstick: NEGATIVE
Ketones, ur: NEGATIVE mg/dL
Nitrite: NEGATIVE
Protein, ur: NEGATIVE mg/dL
Specific Gravity, Urine: 1.009 (ref 1.005–1.030)
Urobilinogen, UA: 0.2 mg/dL (ref 0.0–1.0)
pH: 7 (ref 5.0–8.0)

## 2013-08-29 LAB — POC URINE PREG, ED: Preg Test, Ur: NEGATIVE

## 2013-08-29 LAB — CBG MONITORING, ED: GLUCOSE-CAPILLARY: 82 mg/dL (ref 70–99)

## 2013-08-29 MED ORDER — FLUCONAZOLE 150 MG PO TABS
150.0000 mg | ORAL_TABLET | Freq: Once | ORAL | Status: AC
  Administered 2013-08-29: 150 mg via ORAL
  Filled 2013-08-29: qty 1

## 2013-08-29 MED ORDER — ALBUTEROL SULFATE HFA 108 (90 BASE) MCG/ACT IN AERS
2.0000 | INHALATION_SPRAY | Freq: Once | RESPIRATORY_TRACT | Status: AC
  Administered 2013-08-29: 2 via RESPIRATORY_TRACT
  Filled 2013-08-29: qty 6.7

## 2013-08-29 NOTE — ED Provider Notes (Signed)
CSN: [id number]     Arrival date & time 08/29/13  1155 History   First MD Initiated Contact with Patient 08/29/13 1316     Chief Complaint  Patient presents with  . Vaginal Discharge     (Consider location/radiation/quality/duration/timing/severity/associated sxs/prior Treatment) The history is provided by the patient.   Patient presents c/o abnormal vaginal discharge x several weeks.  States she has had intermittent BV and yeast infections about every other month for the past year.  Denies fevers, chills, abdominal pain, urinary symptoms, abnormal vaginal bleeding, change in menses.  Pt states she is gay and therefore knows she is not pregnant.  Does use sex toys which she has a sanitizing solution for, denies sharing the toys.  Uses maxi pads not tampons.  No changes in soaps, no douching.     Past Medical History  Diagnosis Date  . Asthma   . Strep throat    History reviewed. No pertinent past surgical history. Family History  Problem Relation Age of Onset  . Cancer Mother    History  Substance Use Topics  . Smoking status: Never Smoker   . Smokeless tobacco: Never Used  . Alcohol Use: Yes     Comment: occasional per pt   OB History   Grav Para Term Preterm Abortions TAB SAB Ect Mult Living                 Review of Systems  Constitutional: Negative for fever.  Respiratory: Positive for cough. Negative for shortness of breath.   Cardiovascular: Negative for chest pain.  Gastrointestinal: Negative for nausea, vomiting, abdominal pain and diarrhea.  Genitourinary: Positive for vaginal discharge. Negative for dysuria, urgency, frequency and vaginal bleeding.  All other systems reviewed and are negative.      Allergies  Review of patient's allergies indicates no known allergies.  Home Medications  No current outpatient prescriptions on file. BP 107/68  Pulse 82  Temp(Src) 98 F (36.7 C) (Oral)  Resp 17  SpO2 100%  LMP 08/13/2013 Physical Exam  Nursing note  and vitals reviewed. Constitutional: She appears well-developed and well-nourished. No distress.  HENT:  Head: Normocephalic and atraumatic.  Neck: Neck supple.  Cardiovascular: Normal rate and regular rhythm.   Pulmonary/Chest: Effort normal and breath sounds normal. No respiratory distress. She has no wheezes. She has no rales.  Abdominal: Soft. She exhibits no distension and no mass. There is no tenderness. There is no rebound and no guarding.  Genitourinary: Uterus is not tender. Cervix exhibits no motion tenderness, no discharge and no friability. Right adnexum displays no mass, no tenderness and no fullness. Left adnexum displays no mass, no tenderness and no fullness. No tenderness or bleeding around the vagina. No foreign body around the vagina. Vaginal discharge found.  Thick white curdlike discharge  Neurological: She is alert.  Skin: She is not diaphoretic.  Psychiatric: She has a normal mood and affect. Her behavior is normal. Thought content normal.    ED Course  Procedures (including critical care time) Labs Review Labs Reviewed  WET PREP, GENITAL - Abnormal; Notable for the following:    WBC, Wet Prep HPF POC FEW (*)    All other components within normal limits  URINALYSIS, ROUTINE W REFLEX MICROSCOPIC - Abnormal; Notable for the following:    APPearance CLOUDY (*)    Leukocytes, UA SMALL (*)    All other components within normal limits  URINE MICROSCOPIC-ADD ON - Abnormal; Notable for the following:    Squamous Epithelial /  LPF FEW (*)    All other components within normal limits  GC/CHLAMYDIA PROBE AMP  POC URINE PREG, ED  CBG MONITORING, ED   Imaging Review No results found.  EKG Interpretation   None       MDM   Final diagnoses:  Vaginal yeast infection  Asthma in adult without complication    Patient with clinical yeast infection on pelvic exam.  She has had recurrent infections this year but is not known to be immunocompromised.  CBG is normal.   Yeast is urine is likely vaginal contaminant of specimen.  Pt also requested inhaler for her asthma, currently with very mild URI.  Discussed result, findings, treatment, and follow up  with patient.  Pt given return precautions.  Pt verbalizes understanding and agrees with plan.        Trixie Dredge, PA-C 08/29/13 1540

## 2013-08-29 NOTE — ED Notes (Signed)
Per pt, states vaginal discharge for 3 weeks-pelvic discomfort-states she also wants an inhaler

## 2013-08-29 NOTE — Discharge Instructions (Signed)
Read the information below.  You may return to the Emergency Department at any time for worsening condition or any new symptoms that concern you.  If you develop high fevers, abdominal pain, uncontrolled vomiting, or are unable to tolerate fluids by mouth, return to the ER for a recheck.    Candida Infection, Adult A candida infection (also called yeast, fungus and Monilia infection) is an overgrowth of yeast that can occur anywhere on the body. A yeast infection commonly occurs in warm, moist body areas. Usually, the infection remains localized but can spread to become a systemic infection. A yeast infection may be a sign of a more severe disease such as diabetes, leukemia, or AIDS. A yeast infection can occur in both men and women. In women, Candida vaginitis is a vaginal infection. It is one of the most common causes of vaginitis. Men usually do not have symptoms or know they have an infection until other problems develop. Men may find out they have a yeast infection because their sex partner has a yeast infection. Uncircumcised men are more likely to get a yeast infection than circumcised men. This is because the uncircumcised glans is not exposed to air and does not remain as dry as that of a circumcised glans. Older adults may develop yeast infections around dentures. CAUSES  Women  Antibiotics.  Steroid medication taken for a long time.  Being overweight (obese).  Diabetes.  Poor immune condition.  Certain serious medical conditions.  Immune suppressive medications for organ transplant patients.  Chemotherapy.  Pregnancy.  Menstration.  Stress and fatigue.  Intravenous drug use.  Oral contraceptives.  Wearing tight-fitting clothes in the crotch area.  Catching it from a sex partner who has a yeast infection.  Spermicide.  Intravenous, urinary, or other catheters. Men  Catching it from a sex partner who has a yeast infection.  Having oral or anal sex with a person  who has the infection.  Spermicide.  Diabetes.  Antibiotics.  Poor immune system.  Medications that suppress the immune system.  Intravenous drug use.  Intravenous, urinary, or other catheters. SYMPTOMS  Women  Thick, white vaginal discharge.  Vaginal itching.  Redness and swelling in and around the vagina.  Irritation of the lips of the vagina and perineum.  Blisters on the vaginal lips and perineum.  Painful sexual intercourse.  Low blood sugar (hypoglycemia).  Painful urination.  Bladder infections.  Intestinal problems such as constipation, indigestion, bad breath, bloating, increase in gas, diarrhea, or loose stools. Men  Men may develop intestinal problems such as constipation, indigestion, bad breath, bloating, increase in gas, diarrhea, or loose stools.  Dry, cracked skin on the penis with itching or discomfort.  Jock itch.  Dry, flaky skin.  Athlete's foot.  Hypoglycemia. DIAGNOSIS  Women  A history and an exam are performed.  The discharge may be examined under a microscope.  A culture may be taken of the discharge. Men  A history and an exam are performed.  Any discharge from the penis or areas of cracked skin will be looked at under the microscope and cultured.  Stool samples may be cultured. TREATMENT  Women  Vaginal antifungal suppositories and creams.  Medicated creams to decrease irritation and itching on the outside of the vagina.  Warm compresses to the perineal area to decrease swelling and discomfort.  Oral antifungal medications.  Medicated vaginal suppositories or cream for repeated or recurrent infections.  Wash and dry the irritation areas before applying the cream.  Eating  yogurt with lactobacillus may help with prevention and treatment.  Sometimes painting the vagina with gentian violet solution may help if creams and suppositories do not work. Men  Antifungal creams and oral antifungal  medications.  Sometimes treatment must continue for 30 days after the symptoms go away to prevent recurrence. HOME CARE INSTRUCTIONS  Women  Use cotton underwear and avoid tight-fitting clothing.  Avoid colored, scented toilet paper and deodorant tampons or pads.  Do not douche.  Keep your diabetes under control.  Finish all the prescribed medications.  Keep your skin clean and dry.  Consume milk or yogurt with lactobacillus active culture regularly. If you get frequent yeast infections and think that is what the infection is, there are over-the-counter medications that you can get. If the infection does not show healing in 3 days, talk to your caregiver.  Tell your sex partner you have a yeast infection. Your partner may need treatment also, especially if your infection does not clear up or recurs. Men  Keep your skin clean and dry.  Keep your diabetes under control.  Finish all prescribed medications.  Tell your sex partner that you have a yeast infection so they can be treated if necessary. SEEK MEDICAL CARE IF:   Your symptoms do not clear up or worsen in one week after treatment.  You have an oral temperature above 102 F (38.9 C).  You have trouble swallowing or eating for a prolonged time.  You develop blisters on and around your vagina.  You develop vaginal bleeding and it is not your menstrual period.  You develop abdominal pain.  You develop intestinal problems as mentioned above.  You get weak or lightheaded.  You have painful or increased urination.  You have pain during sexual intercourse. MAKE SURE YOU:   Understand these instructions.  Will watch your condition.  Will get help right away if you are not doing well or get worse. Document Released: 08/02/2004 Document Revised: 09/17/2011 Document Reviewed: 11/14/2009 [facility] Patient Information 2014 [place], [place].   Emergency Department Resource Guide 1) Find a Doctor and Pay Out of  Pocket Although you won't have to find out who is covered by your insurance plan, it is a good idea to ask around and get recommendations. You will then need to call the office and see if the doctor you have chosen will accept you as a new patient and what types of options they offer for patients who are self-pay. Some doctors offer discounts or will set up payment plans for their patients who do not have insurance, but you will need to ask so you aren't surprised when you get to your appointment.  2) Contact Your Local Health Department Not all health departments have doctors that can see patients for sick visits, but many do, so it is worth a call to see if yours does. If you don't know where your local health department is, you can check in your phone book. The CDC also has a tool to help you locate your state's health department, and many state websites also have listings of all of their local health departments.  3) Find a Walk-in Clinic If your illness is not likely to be very severe or complicated, you may want to try a walk in clinic. These are popping up all over the country in pharmacies, drugstores, and shopping centers. They're usually staffed by nurse practitioners or physician assistants that have been trained to treat common illnesses and complaints. They're usually fairly quick and  inexpensive. However, if you have serious medical issues or chronic medical problems, these are probably not your best option.  No Primary Care Doctor: - Call Health Connect at  [phone] - they can help you locate a primary care doctor that  accepts your insurance, provides certain services, etc. - Physician Referral Service- [phone]  Chronic Pain Problems: Organization         Address  Phone   Notes  [facility]  [phone] Patients need to be referred by their primary care doctor.   Medication Assistance: Organization         Address  Phone   Notes  [facility]  Medication [facility] [address] [place] [place], [place] [address] [phone] --Must be a resident of [facility] -- Must have NO insurance coverage whatsoever (no Medicaid/ Medicare, etc.) -- The pt. MUST have a primary care doctor that directs their care regularly and follows them in the community   [facility]  [phone]   [employer]  [phone]    Agencies that provide inexpensive medical care: Organization         Address  Phone   Notes  [facility]  [phone]   [facility]    [phone]   [facility] [address] [place], [place] [address] [phone]   [facility] [address] [place] [place] [phone]   Planned Parenthood    [phone]   [facility]    [phone]   [facility]  [address], [place] Phone:  [phone], Fax:  [phone] Hours of Operation:  9 am - 6 pm, M-F.  Also accepts Medicaid/Medicare and self-pay.  [facility]  [address], [place] Phone: [phone], Fax: [phone]. Hours of Operation:  8:30 am - 5:30 pm, M-F.  Also accepts Medicaid and self-pay.  [facility] [place] [address], [place] Phone: [phone]   [facility] [address] Natasha Bence [place], [place] [phone], Ext. [phone] Mondays & Thursdays: 7-9 AM.  First 15 patients are seen on a first come, first serve basis.    Medicaid-accepting [facility] Providers:  Organization         Address  Phone   Notes  [facility] [address], [place] [phone] Also accepts self-pay patients.  [facility] [address] Laurell Josephs [place], [place]  [phone]   [facility] [address], [place] [phone]   [facility] [address], [place] [phone]   Renaye Rakers [address], [place]   [phone] Only accepts [place] Access [place] patients after they have their name applied to their card.   Self-Pay (no insurance) in [facility]:  Organization         Address  Phone   Notes  Sickle Cell Patients, [facility] [address] [place], [place] [phone]   [facility] [address] [place], [place] [phone]   [facility] [place]  [address], [place] [phone]   [facility]/Dr. Osei-Bonsu  [address], [place] or [address], [place] [phone] Phone number for both  [place] and [place] locations is the same.  [facility] [address], [place] [phone]   [facility] [address], [place] or [address] [phone]   [facility] [address] [place] [phone], phone; [phone], fax Sees patients 1st and 3rd Saturday of every month.  Must not qualify for public or private insurance (i.e. Medicaid, Medicare, [place] Health Choice, Veterans' Benefits)  Household income should be no more than 200% of the poverty level The clinic cannot treat you if you are pregnant or think you are pregnant  Sexually transmitted diseases are not treated at the clinic.    Dental Care: Organization         Address  Phone  Notes  [facility] [address] [place], [place] [phone] Accepts children up to age 22 who are enrolled in [place] or [place] Health Choice; pregnant women with a Medicaid card; and children who have applied for Medicaid or [place] Health Choice, but were declined, whose parents can pay a reduced fee at time of service.  [facility]  [address], [place]  [phone] Accepts children up to age 62 who are enrolled in [place] or [place] Health Choice; pregnant women with a Medicaid card; and children who have applied for Medicaid or [place] Health Choice, but were declined, whose parents can pay a reduced fee at time of service.  [facility]  [address] [place], [place] [phone] Patients are seen by appointment only. Walk-ins are not accepted. [facility] will see patients 8 years of age and older. Monday - Tuesday (8am-5pm) Most Wednesdays (8:30-5pm) $30 per visit, cash only  [facility]  [address] [place] [address], [facility] [phone] Patients are seen by appointment only. Walk-ins are not accepted. [facility] will see patients 58 years of age and older. One Wednesday Evening (Monthly: Volunteer Based).  $30 per visit, cash only  [employer]  [phone] for adults; Children under age 68, call Graduate Pediatric Dentistry at [phone]. Children aged 54-14, please call [phone] to request a pediatric application.  Dental services are provided in all areas of dental care including fillings, crowns and bridges, complete and partial dentures, implants, gum treatment, root canals, and extractions. Preventive care is also provided. Treatment is provided to both adults and children. Patients are selected via a lottery and there is often a waiting list.   [facility] [address], [place]  [phone] [email or web address]   [facility] [address] [place], [place] [phone], Ext. [phone] Second and Fourth Thursday of each month, opens at 6:30 AM; Clinic ends at 9 AM.  Patients are seen on a first-come first-served basis, and a limited number are seen during each clinic.   [facility]  [address] Ether Griffins [place], [place] [phone]   Eligibility Requirements You must have lived in [place], [place], or [place]  counties for at least the last three months.   You cannot be eligible for state or federal sponsored [place], including [employer], [place], or [employer].   You generally cannot be eligible for healthcare insurance through your employer.    How to apply: Eligibility screenings are held every Tuesday and Wednesday afternoon from 1:00 pm until 4:00 pm.  You do not need an appointment for the interview!  [facility] [address], [place], [place] [phone]   [facility] Health Department  [phone]   [facility] Health Department  [phone]   [facility] Health Department  [phone]    Behavioral Health Resources in the Community: Intensive Outpatient Programs Organization         Address  Phone  Notes  [facility] [address] [place], [place] [phone]   [facility] [address] [place], [place] [phone]   [facility] [address], [place], [place]  [phone]   [facility] [address]  [place], [place] [phone] or [phone]   Substance Abuse Resources Organization         Address  Phone  Notes  [facility]  [phone]   [facility]  [phone]   [facility] [place]  [phone]   Floydene Flock  [phone]   Residential & Outpatient Substance Abuse Program  [phone]   Psychological Services Organization         Address  Phone  Notes  [facility] Behavioral Health  [phone]   [facility]  [phone]   [facility] Mental Health [address] [place] [phone] or [phone]    Mobile Crisis Teams Organization         Address  Phone  Notes  [facility], Mobile Crisis Care Unit  [phone]   [facility]  [address] [place], [place] [phone]   Doristine Locks [address] [place]  [place] [phone]    Self-Help/Support Groups Organization         Address  Phone             Notes  [facility] [place] - variety of support groups  336- I7437963 Call for more information  Narcotics Anonymous (NA), [facility] [address], [employer] [place]  2 meetings at this location   Statistician         Address  Phone  Notes  [facility] [address],    [place]  [phone]   [facility]  [address] [place] [address], [place], [place] [phone]   [facility] [address] [place], [place] [phone] Admissions: 8am-3pm M-F  [facility] [address]    [place], [place] [phone]   [facility] [address] [place], [place], [place] [phone]   [facility] [address]  [place], [place] [phone]   [facility] - Intensive Outpatient [address] Laurell Joseph[address], [place], [place] [phone]   [facility] ([facility] [address] [place]  [place], [place] [phone] or [phone]   [facility] ([facility]) [address] [place], [place] [phone] Accepts Medicaid  [facility] [address],  [place] [phone] Substance Abuse/Addiction Treatment   [facility] Organization         Address  Phone  Notes  [facility]  [phone]   Angie Fava, PhD [address] Ervin Knack [place], [place]   [phone] or [phone]   [facility]   [address] [place], [place] [phone]   [facility] [address], [place], [place] [phone] Insurance/Medicaid/sponsorship through [employer] and Families [address]  [place], [place] [phone] [address] [place], [place] [phone]    Dr. Adele Schilder  [phone]   [facility] Dept. 1) [address] [place] 2) [place] 3)  [address], [place] [phone]  [phone]   [place] [phone] or [phone] (After Hours)

## 2013-08-31 ENCOUNTER — Encounter (HOSPITAL_COMMUNITY): Payer: Self-pay | Admitting: Emergency Medicine

## 2013-08-31 ENCOUNTER — Emergency Department (HOSPITAL_COMMUNITY)
Admission: EM | Admit: 2013-08-31 | Discharge: 2013-08-31 | Disposition: A | Discharge status: Home / Self Care | Payer: BC Managed Care – PPO | Attending: Emergency Medicine | Admitting: Emergency Medicine

## 2013-08-31 DIAGNOSIS — Z8619 Personal history of other infectious and parasitic diseases: Secondary | ICD-10-CM | POA: Insufficient documentation

## 2013-08-31 DIAGNOSIS — J45909 Unspecified asthma, uncomplicated: Secondary | ICD-10-CM | POA: Insufficient documentation

## 2013-08-31 DIAGNOSIS — N390 Urinary tract infection, site not specified: Secondary | ICD-10-CM

## 2013-08-31 LAB — URINE MICROSCOPIC-ADD ON

## 2013-08-31 LAB — GC/CHLAMYDIA PROBE AMP
CT Probe RNA: NEGATIVE
GC PROBE AMP APTIMA: NEGATIVE

## 2013-08-31 LAB — URINALYSIS, ROUTINE W REFLEX MICROSCOPIC
Bilirubin Urine: NEGATIVE
Glucose, UA: NEGATIVE mg/dL
HGB URINE DIPSTICK: NEGATIVE
Ketones, ur: NEGATIVE mg/dL
Nitrite: NEGATIVE
Protein, ur: NEGATIVE mg/dL
SPECIFIC GRAVITY, URINE: 1.02 (ref 1.005–1.030)
Urobilinogen, UA: 0.2 mg/dL (ref 0.0–1.0)
pH: 7.5 (ref 5.0–8.0)

## 2013-08-31 MED ORDER — SULFAMETHOXAZOLE-TRIMETHOPRIM 800-160 MG PO TABS: 1.0000 | ORAL_TABLET | Freq: Two times a day (BID) | ORAL | Status: AC

## 2013-08-31 MED ORDER — PREDNISONE 10 MG PO TABS: ORAL_TABLET | ORAL | Status: DC

## 2013-08-31 NOTE — ED Notes (Addendum)
Pt reports non productive cough x3 days, reports chest pain during inspiration started last night, pain 7/10. Denies n/v/d.   Pt also reports she was seen in ED 2/21 for yeast infection, given diflucan, reports no relief and has urinary frequency. Denies dysuria.

## 2013-08-31 NOTE — ED Provider Notes (Signed)
CSN: [id number]     Arrival date & time 08/31/13  1213 History  This chart was scribed for non-physician practitioner, Clyde Canterbury, working with Richardean Canal, MD by Smiley Houseman, ED Scribe. This patient was seen in room WTR8/WTR8 and the patient's care was started at 1:06 PM.    Chief Complaint  Patient presents with  . chest pain when inhaling    The history is provided by the patient. No language interpreter was used.   HPI Comments: Wendy Morton is a 23 y.o. female with a h/o of asthma who presents to the Emergency Department complaining of chest pain with inspiration that started last night.  Pt states she has a non productive cough and nasal congestion that started about 3 days ago.  Pt denies fever, nausea, emesis, and diarrhea.  Pt states she used her inhaler for the congestion, which provided some relief.  She reports she doesn't have asthma, unless she has developed a cold with congestion.  Pt also states she was treated for a yeast infection with Diflucan 2 days ago.  Pt states the vaginal discharge has increased and is yellowish and whitish in color.  She reports she begin have urinary frequency yesterday.     Past Medical History  Diagnosis Date  . Asthma   . Strep throat    History reviewed. No pertinent past surgical history. Family History  Problem Relation Age of Onset  . Cancer Mother    History  Substance Use Topics  . Smoking status: Never Smoker   . Smokeless tobacco: Never Used  . Alcohol Use: Yes     Comment: occasional per pt   OB History   Grav Para Term Preterm Abortions TAB SAB Ect Mult Living                 Review of Systems  Constitutional: Negative for fever and chills.  HENT: Positive for congestion. Negative for rhinorrhea and sore throat.   Respiratory: Positive for cough. Negative for shortness of breath.   Cardiovascular: Positive for chest pain (with inspiration).  Gastrointestinal: Negative for nausea, vomiting, abdominal pain and  diarrhea.  Genitourinary: Positive for frequency.  Musculoskeletal: Negative for back pain.  Skin: Negative for color change and rash.  Neurological: Negative for syncope.  All other systems reviewed and are negative.   Allergies  Review of patient's allergies indicates no known allergies.  Home Medications   Current Outpatient Rx  Name  Route  Sig  Dispense  Refill  . Chlorphen-Pseudoephed-APAP (THERAFLU FLU/COLD PO)   Oral   Take 10 mLs by mouth every 6 (six) hours as needed (cold/flu).         Marland Kitchen ibuprofen (ADVIL,MOTRIN) 200 MG tablet   Oral   Take 600 mg by mouth every 6 (six) hours as needed for mild pain or moderate pain.          Triage Vitals: BP 101/73  Pulse 87  Temp(Src) 98.3 F (36.8 C) (Oral)  Resp 16  SpO2 99%  LMP 08/13/2013  Physical Exam  Nursing note and vitals reviewed. Constitutional: She is oriented to person, place, and time. She appears well-developed and well-nourished. No distress.  HENT:  Head: Normocephalic and atraumatic.  Right Ear: Tympanic membrane, external ear and ear canal normal.  Left Ear: Tympanic membrane, external ear and ear canal normal.  Nose: Nose normal. No mucosal edema or rhinorrhea.  Mouth/Throat: Uvula is midline and mucous membranes are normal. Posterior oropharyngeal edema and posterior oropharyngeal erythema  present. No oropharyngeal exudate.  Tonsils 2+  Eyes: Conjunctivae and EOM are normal. Right eye exhibits no discharge. Left eye exhibits no discharge.  Neck: Neck supple. No tracheal deviation present.  Cardiovascular: Normal rate and regular rhythm.  Exam reveals no gallop and no friction rub.   No murmur heard. Pulmonary/Chest: Effort normal and breath sounds normal. No respiratory distress. She has no wheezes. She has no rales.  Abdominal: Soft.  Musculoskeletal: Normal range of motion.  Neurological: She is alert and oriented to person, place, and time.  Skin: Skin is warm and dry. No rash noted.   Psychiatric: She has a normal mood and affect. Her behavior is normal. Judgment and thought content normal.    ED Course  Procedures (including critical care time) DIAGNOSTIC STUDIES: Oxygen Saturation is 99% on RA, normal by my interpretation.    COORDINATION OF CARE: 1:14 PM-Will order UA.  Patient informed of current plan of treatment and evaluation and agrees with plan.    Results for orders placed during the hospital encounter of 08/29/13  WET PREP, GENITAL      Result Value Ref Range   Yeast Wet Prep HPF POC NONE SEEN  NONE SEEN   Trich, Wet Prep NONE SEEN  NONE SEEN   Clue Cells Wet Prep HPF POC NONE SEEN  NONE SEEN   WBC, Wet Prep HPF POC FEW (*) NONE SEEN  URINALYSIS, ROUTINE W REFLEX MICROSCOPIC      Result Value Ref Range   Color, Urine YELLOW  YELLOW   APPearance CLOUDY (*) CLEAR   Specific Gravity, Urine 1.009  1.005 - 1.030   pH 7.0  5.0 - 8.0   Glucose, UA NEGATIVE  NEGATIVE mg/dL   Hgb urine dipstick NEGATIVE  NEGATIVE   Bilirubin Urine NEGATIVE  NEGATIVE   Ketones, ur NEGATIVE  NEGATIVE mg/dL   Protein, ur NEGATIVE  NEGATIVE mg/dL   Urobilinogen, UA 0.2  0.0 - 1.0 mg/dL   Nitrite NEGATIVE  NEGATIVE   Leukocytes, UA SMALL (*) NEGATIVE  URINE MICROSCOPIC-ADD ON      Result Value Ref Range   Squamous Epithelial / LPF FEW (*) RARE   WBC, UA 0-2  <3 WBC/hpf   Urine-Other RARE YEAST    POC URINE PREG, ED      Result Value Ref Range   Preg Test, Ur NEGATIVE  NEGATIVE  CBG MONITORING, ED      Result Value Ref Range   Glucose-Capillary 82  70 - 99 mg/dL   Comment 1 Documented in Chart     Comment 2 Notify RN      Imaging Review No results found.  MDM   Final diagnoses:  UTI (lower urinary tract infection)    Urinalysis with large leuks and cloudy, will treat for UTI. No back pain, abdominal pain, fever, chills or N/V. Well appearing, reassuring exam. Discussed plan of care with pt and she agrees to return if symptoms worsen. Increase oral fluid intake  and prescription for Bactrim DS given.   I personally performed the services described in this documentation, which was scribed in my presence. The recorded information has been reviewed and is accurate.    Irish EldersKelly Santez Woodcox, NP 09/03/13 1450

## 2013-08-31 NOTE — Discharge Instructions (Signed)
Urinary Tract Infection Urinary tract infections (UTIs) can develop anywhere along your urinary tract. Your urinary tract is your body's drainage system for removing wastes and extra water. Your urinary tract includes two kidneys, two ureters, a bladder, and a urethra. Your kidneys are a pair of bean-shaped organs. Each kidney is about the size of your fist. They are located below your ribs, one on each side of your spine. CAUSES Infections are caused by microbes, which are microscopic organisms, including fungi, viruses, and bacteria. These organisms are so small that they can only be seen through a microscope. Bacteria are the microbes that most commonly cause UTIs. SYMPTOMS  Symptoms of UTIs may vary by age and gender of the patient and by the location of the infection. Symptoms in young women typically include a frequent and intense urge to urinate and a painful, burning feeling in the bladder or urethra during urination. Older women and men are more likely to be tired, shaky, and weak and have muscle aches and abdominal pain. A fever may mean the infection is in your kidneys. Other symptoms of a kidney infection include pain in your back or sides below the ribs, nausea, and vomiting. DIAGNOSIS To diagnose a UTI, your caregiver will ask you about your symptoms. Your caregiver also will ask to provide a urine sample. The urine sample will be tested for bacteria and white blood cells. White blood cells are made by your body to help fight infection. TREATMENT  Typically, UTIs can be treated with medication. Because most UTIs are caused by a bacterial infection, they usually can be treated with the use of antibiotics. The choice of antibiotic and length of treatment depend on your symptoms and the type of bacteria causing your infection. HOME CARE INSTRUCTIONS  If you were prescribed antibiotics, take them exactly as your caregiver instructs you. Finish the medication even if you feel better after you  have only taken some of the medication.  Drink enough water and fluids to keep your urine clear or pale yellow.  Avoid caffeine, tea, and carbonated beverages. They tend to irritate your bladder.  Empty your bladder often. Avoid holding urine for long periods of time.  Empty your bladder before and after sexual intercourse.  After a bowel movement, women should cleanse from front to back. Use each tissue only once. SEEK MEDICAL CARE IF:   You have back pain.  You develop a fever.  Your symptoms do not begin to resolve within 3 days. SEEK IMMEDIATE MEDICAL CARE IF:   You have severe back pain or lower abdominal pain.  You develop chills.  You have nausea or vomiting.  You have continued burning or discomfort with urination. MAKE SURE YOU:   Understand these instructions.  Will watch your condition.  Will get help right away if you are not doing well or get worse. Document Released: 04/04/2005 Document Revised: 12/25/2011 Document Reviewed: 08/03/2011 [facility] Patient Information 2014 [employer] [place].   Increase oral fluids Take antibiotic as prescribed Return if fever, back pain, nausea or no gradual improvement

## 2013-09-03 NOTE — ED Provider Notes (Signed)
Medical screening examination/treatment/procedure(s) were performed by non-physician practitioner and as supervising physician I was immediately available for consultation/collaboration.  EKG Interpretation    Date/Time:  Monday August 31 2013 12:19:59 EST Ventricular Rate:  92 PR Interval:  178 QRS Duration: 70 QT Interval:  348 QTC Calculation: 430 R Axis:   77 Text Interpretation:  Sinus rhythm ED PHYSICIAN INTERPRETATION AVAILABLE IN [facility] Confirmed by TEST, RECORD ([id number]) on 09/02/2013 8:47:08 AM              Richardean Canalavid H Margaretann Abate, MD 09/03/13 1452

## 2013-11-20 NOTE — Telephone Encounter (Signed)
err

## 2014-01-05 ENCOUNTER — Encounter (HOSPITAL_COMMUNITY): Payer: Self-pay | Admitting: Emergency Medicine

## 2014-01-05 ENCOUNTER — Emergency Department (HOSPITAL_COMMUNITY)
Admission: EM | Admit: 2014-01-05 | Discharge: 2014-01-05 | Disposition: A | Discharge status: Home / Self Care | Payer: BC Managed Care – PPO | Attending: Emergency Medicine | Admitting: Emergency Medicine

## 2014-01-05 DIAGNOSIS — J020 Streptococcal pharyngitis: Secondary | ICD-10-CM | POA: Insufficient documentation

## 2014-01-05 DIAGNOSIS — H9209 Otalgia, unspecified ear: Secondary | ICD-10-CM | POA: Insufficient documentation

## 2014-01-05 DIAGNOSIS — J45909 Unspecified asthma, uncomplicated: Secondary | ICD-10-CM | POA: Insufficient documentation

## 2014-01-05 LAB — RAPID STREP SCREEN (MED CTR MEBANE ONLY): STREPTOCOCCUS, GROUP A SCREEN (DIRECT): NEGATIVE

## 2014-01-05 MED ORDER — PENICILLIN G BENZATHINE 1200000 UNIT/2ML IM SUSP
1.2000 10*6.[IU] | Freq: Once | INTRAMUSCULAR | Status: AC
  Administered 2014-01-05: 1.2 10*6.[IU] via INTRAMUSCULAR
  Filled 2014-01-05: qty 2

## 2014-01-05 NOTE — Discharge Instructions (Signed)
You have been treated here for strep throat. Refer to attached documents for more information. Return to the ED with worsening or concerning symptoms.

## 2014-01-05 NOTE — ED Notes (Signed)
Pt is c/o sore throat  Pt states it started on Sunday night  Pt states she has a headache and her ears hurt

## 2014-01-05 NOTE — ED Provider Notes (Signed)
Medical screening examination/treatment/procedure(s) were performed by non-physician practitioner and as supervising physician I was immediately available for consultation/collaboration.   EKG Interpretation None       Olivia Mackielga M Otter, MD 01/05/14 [phone]

## 2014-01-05 NOTE — ED Provider Notes (Signed)
CSN: [id number]     Arrival date & time 01/05/14  0620 History   First MD Initiated Contact with Patient 01/05/14 [phone]     Chief Complaint  Patient presents with  . Sore Throat     (Consider location/radiation/quality/duration/timing/severity/associated sxs/prior Treatment) HPI Comments: Patient is a 23 year old female who presents with a 2 day history of sore throat. Patient reports gradual onset and progressively worsening sharp, severe throat pain. The pain is constant and made worse with swallowing. The pain is localized to the patient's throat and equal on both sides. Nothing alleviates the pain. The patient has not tried anything for symptom relief. Patient reports associated subjective fever, cervical adenopathy, and non productive cough. Patient denies headache, visual changes, sinus congestion, difficulty breathing, chest pain, SOB, abdominal pain, NVD.      Past Medical History  Diagnosis Date  . Asthma   . Strep throat    History reviewed. No pertinent past surgical history. Family History  Problem Relation Age of Onset  . Cancer Mother    History  Substance Use Topics  . Smoking status: Never Smoker   . Smokeless tobacco: Never Used  . Alcohol Use: Yes     Comment: occasional per pt   OB History   Grav Para Term Preterm Abortions TAB SAB Ect Mult Living                 Review of Systems  Constitutional: Negative for fever, chills and fatigue.  HENT: Positive for ear pain and sore throat. Negative for trouble swallowing.   Eyes: Negative for visual disturbance.  Respiratory: Negative for shortness of breath.   Cardiovascular: Negative for chest pain and palpitations.  Gastrointestinal: Negative for nausea, vomiting, abdominal pain and diarrhea.  Genitourinary: Negative for dysuria and difficulty urinating.  Musculoskeletal: Negative for arthralgias and neck pain.  Skin: Negative for color change.  Neurological: Negative for dizziness and weakness.   Psychiatric/Behavioral: Negative for dysphoric mood.      Allergies  Review of patient's allergies indicates no known allergies.  Home Medications   Prior to Admission medications   Medication Sig Start Date End Date Taking? Authorizing Provider  ibuprofen (ADVIL,MOTRIN) 200 MG tablet Take 600 mg by mouth every 6 (six) hours as needed for mild pain or moderate pain.   Yes Historical Provider, MD   BP 116/74  Pulse 96  Temp(Src) 99.1 F (37.3 C) (Oral)  Resp 18  SpO2 99%  LMP 12/26/2013 Physical Exam  Nursing note and vitals reviewed. Constitutional: She appears well-developed and well-nourished. No distress.  HENT:  Head: Normocephalic and atraumatic.  Bilateral tonsillar erythema and edema with scant amount of exudate.   Eyes: Conjunctivae and EOM are normal. Pupils are equal, round, and reactive to light.  Neck: Normal range of motion.  Cardiovascular: Normal rate and regular rhythm.  Exam reveals no gallop and no friction rub.   No murmur heard. Pulmonary/Chest: Effort normal and breath sounds normal. She has no wheezes. She has no rales. She exhibits no tenderness.  Abdominal: Soft. There is no tenderness.  Musculoskeletal: Normal range of motion.  Lymphadenopathy:    She has cervical adenopathy.  Neurological: She is alert.  Speech is goal-oriented. Moves limbs without ataxia.   Skin: Skin is warm and dry.  Psychiatric: She has a normal mood and affect. Her behavior is normal.    ED Course  Procedures (including critical care time) Labs Review Labs Reviewed  RAPID STREP SCREEN  CULTURE, GROUP A STREP  Imaging Review No results found.   EKG Interpretation None      MDM   Final diagnoses:  Strep pharyngitis    6:42 AM Rapid strep pending. Vitals stable and patient afebrile.   7:02 AM Patient's rapid strep is negative, but I will treat the patient's based on clinical appearance. Patient instructed to return to the ED with worsening or  concerning symptoms.    Emilia BeckKaitlyn Laramie Gelles, PA-C 01/05/14 [phone]

## 2014-01-06 LAB — CULTURE, GROUP A STREP

## 2014-04-24 ENCOUNTER — Emergency Department (HOSPITAL_COMMUNITY)
Admission: EM | Admit: 2014-04-24 | Discharge: 2014-04-24 | Disposition: A | Discharge status: Home / Self Care | Payer: BC Managed Care – PPO | Attending: Emergency Medicine | Admitting: Emergency Medicine

## 2014-04-24 ENCOUNTER — Encounter (HOSPITAL_COMMUNITY): Payer: Self-pay | Admitting: Emergency Medicine

## 2014-04-24 DIAGNOSIS — J45909 Unspecified asthma, uncomplicated: Secondary | ICD-10-CM | POA: Insufficient documentation

## 2014-04-24 DIAGNOSIS — N898 Other specified noninflammatory disorders of vagina: Secondary | ICD-10-CM | POA: Insufficient documentation

## 2014-04-24 DIAGNOSIS — Z3202 Encounter for pregnancy test, result negative: Secondary | ICD-10-CM | POA: Insufficient documentation

## 2014-04-24 DIAGNOSIS — Z79899 Other long term (current) drug therapy: Secondary | ICD-10-CM | POA: Insufficient documentation

## 2014-04-24 LAB — URINALYSIS, ROUTINE W REFLEX MICROSCOPIC
Bilirubin Urine: NEGATIVE
Glucose, UA: NEGATIVE mg/dL
KETONES UR: NEGATIVE mg/dL
Nitrite: NEGATIVE
PH: 6 (ref 5.0–8.0)
Protein, ur: NEGATIVE mg/dL
Specific Gravity, Urine: 1.016 (ref 1.005–1.030)
Urobilinogen, UA: 1 mg/dL (ref 0.0–1.0)

## 2014-04-24 LAB — WET PREP, GENITAL
Clue Cells Wet Prep HPF POC: NONE SEEN
Trich, Wet Prep: NONE SEEN
YEAST WET PREP: NONE SEEN

## 2014-04-24 LAB — PREGNANCY, URINE: PREG TEST UR: NEGATIVE

## 2014-04-24 LAB — URINE MICROSCOPIC-ADD ON

## 2014-04-24 MED ORDER — CEFTRIAXONE SODIUM 250 MG IJ SOLR
250.0000 mg | Freq: Once | INTRAMUSCULAR | Status: AC
  Administered 2014-04-24: 250 mg via INTRAMUSCULAR
  Filled 2014-04-24: qty 250

## 2014-04-24 MED ORDER — AZITHROMYCIN 250 MG PO TABS
1000.0000 mg | ORAL_TABLET | Freq: Once | ORAL | Status: AC
  Administered 2014-04-24: 1000 mg via ORAL
  Filled 2014-04-24: qty 4

## 2014-04-24 MED ORDER — LIDOCAINE HCL 1 % IJ SOLN
INTRAMUSCULAR | Status: AC
  Administered 2014-04-24: 0.9 mL
  Filled 2014-04-24: qty 20

## 2014-04-24 NOTE — ED Notes (Signed)
Pt reports using a hotel soap to wash herself Saturday, started to have some "discomfort", pt reports discharge today.

## 2014-04-24 NOTE — ED Provider Notes (Signed)
Medical screening examination/treatment/procedure(s) were performed by non-physician practitioner and as supervising physician I was immediately available for consultation/collaboration.   EKG Interpretation None        Lyanne CoKevin M Meela Wareing, MD 04/24/14 [phone]

## 2014-04-24 NOTE — Discharge Instructions (Signed)
Do not hesitate to return to the emergency room for any new, worsening or concerning symptoms. ° °Please obtain primary care using resource guide below. But the minute you were seen in the emergency room and that they will need to obtain records for further outpatient management. ° ° ° °Emergency Department Resource Guide °1) Find a Doctor and Pay Out of Pocket °Although you won't have to find out who is covered by your insurance plan, it is a good idea to ask around and get recommendations. You will then need to call the office and see if the doctor you have chosen will accept you as a new patient and what types of options they offer for patients who are self-pay. Some doctors offer discounts or will set up payment plans for their patients who do not have insurance, but you will need to ask so you aren't surprised when you get to your appointment. ° °2) Contact Your Local Health Department °Not all health departments have doctors that can see patients for sick visits, but many do, so it is worth a call to see if yours does. If you don't know where your local health department is, you can check in your phone book. The CDC also has a tool to help you locate your state's health department, and many state websites also have listings of all of their local health departments. ° °3) Find a Walk-in Clinic °If your illness is not likely to be very severe or complicated, you may want to try a walk in clinic. These are popping up all over the country in pharmacies, drugstores, and shopping centers. They're usually staffed by nurse practitioners or physician assistants that have been trained to treat common illnesses and complaints. They're usually fairly quick and inexpensive. However, if you have serious medical issues or chronic medical problems, these are probably not your best option. ° °No Primary Care Doctor: °- Call [facility] at  832-8000 - they can help you locate a primary care doctor that  accepts your  insurance, provides certain services, etc. °- Physician Referral Service- 1-800-533-3463 ° °Chronic Pain Problems: °Organization         Address  Phone   Notes  °[facility]  (336) 297-2271 Patients need to be referred by their primary care doctor.  ° °Medication Assistance: °Organization         Address  Phone   Notes  °[place] [facility] [address] °[place], [place] [address] (336) 641-8030 --Must be a resident of [place] °-- Must have NO insurance coverage whatsoever (no Medicaid/ Medicare, etc.) °-- The pt. MUST have a primary care doctor that directs their care regularly and follows them in the community °  °[facility]  (866) 331-1348   °[employer]  (888) 892-1162   ° °Agencies that provide inexpensive medical care: °Organization         Address  Phone   Notes  °[facility]  (336) 832-8035   °[facility]    (336) 832-7272   °[facility] [address] °[place], [place] [address] (336) 832-4777   °[facility] [address], °[place] (336) 271-4999   °[facility]    (336) 373-0678   °[facility]    (336) 272-1050   °[facility] ° [address], [place] Phone:  (336) 832-4444, Fax:  (336) 832-4440 Hours of Operation:  9 am -   6 pm, M-F.  Also accepts Medicaid/Medicare and self-pay.  °[facility] ° [address], [place] Phone: (336) 832-3150, Fax: (336) 832-3151. Hours of Operation:  8:30 am - 5:30 pm, M-F.  Also accepts Medicaid and self-pay.  °[facility] [place] [address], [place] Phone: (336) 878-6027   °[facility] [address], [place], [place] (336)723-1848, Ext. 123 Mondays & Thursdays: 7-9 AM.  First 15 patients are seen on a first come, first serve basis. °  ° °Medicaid-accepting [place] Providers: ° °Organization          Address  Phone   Notes  °[facility] [address], [place] (336) 641-2100 Also accepts self-pay patients.  °[facility] [address], [place] ° (336) 856-9996   °[facility] [address], [place] (336) 288-8857   °[facility] [address] [place] [address], [place] (336) 299-7000   °Veita Bland [address], [place]  ° (336) 373-1557 Only accepts [place] Access Medicaid patients after they have their name applied to their card.  ° °Self-Pay (no insurance) in [place]: ° °Organization         Address  Phone   Notes  °Sickle Cell Patients, [facility] [address],  [place] (336) 832-1970   °[facility] [address], [place] (336) 832-4400   °[facility] ° [address], [place] (336) 992-4800   °[facility]/Dr. Osei-Bonsu ° [address] [place] [address], [place] or [address], [place] (336) 841-8500 Phone number for both [place] and [place] locations is the same.  °[facility] [address], [place] (336) 299-0000   °[facility] [address] [place] [address], [place] or [address] (336) 852-7530 °(336) 878-2260   °[facility] [address], [place] (336) 350-1642, phone; (336) 294-5005, fax Sees patients 1st and 3rd Saturday of every month.  Must not qualify for public or private insurance (i.e. Medicaid, Medicare, [place] Health Choice, Veterans' Benefits) • Household income should be no more than 200% of the poverty level •The clinic cannot treat you if you are pregnant or think you are pregnant • Sexually transmitted diseases are not treated at the clinic.  ° ° °Dental Care: °Organization         Address  Phone  Notes  °[facility] [address],  [place] (336) 641-6152 Accepts children up to age 21 who are enrolled in Medicaid or [place] Health Choice; pregnant women with a Medicaid card; and children who have applied for Medicaid or [place] Health Choice, but were declined, whose parents can pay a reduced fee at time of service.  °[facility] [place]  [address], [place] (336) 641-7733 Accepts children up to age 21 who are enrolled in Medicaid or [place] Health Choice; pregnant women with a Medicaid card; and children who have applied for Medicaid or  Health Choice, but were declined, whose parents can pay a reduced fee at time of service.  °[facility] ° [address], [place] (336) 641-4533 Patients are seen by appointment only. Walk-ins are not accepted. [facility] will see patients 18 years of age and   older. °Monday - Tuesday (8am-5pm) °Most Wednesdays (8:30-5pm) °$30 per visit, cash only  °[facility] ° [address], [place] (336) 641-4533 Patients are seen by appointment only. Walk-ins are not accepted. [facility] will see patients 18 years of age and older. °One Wednesday Evening (Monthly: Volunteer Based).  $30 per visit, cash only  °[facility]  (919) 537-3737 for adults; Children under age 4, call Graduate Pediatric Dentistry at (919) [phone]. Children aged 4-14, please call (919) [phone] to request a pediatric application. ° Dental services are provided in all areas of dental care including fillings, crowns and bridges, complete and partial dentures, implants, gum treatment, root canals, and extractions. Preventive care is also provided. Treatment is provided to both adults and children. °Patients are selected via a lottery and there is often a waiting list. °  °[facility] [address], °[place] ° (336) 763-8833 [email or web address] °  °[facility] [address], [place], [place]  (336)723-1848, Ext. 123 Second and Fourth Thursday of each month, opens at 6:30 AM; Clinic ends at 9 AM.  Patients are seen on a first-come first-served basis, and a limited number are seen during each clinic.  ° °[facility] ° [address], [place], [place] (336) 723-7904   Eligibility Requirements °You must have lived in [place], [place], or [place] counties for at least the last three months. °  You cannot be eligible for state or federal sponsored healthcare insurance, including Veterans Administration, Medicaid, or Medicare. °  You generally cannot be eligible for healthcare insurance through your employer.  °  How to apply: °Eligibility screenings are held every Tuesday and Wednesday afternoon from 1:00 pm until 4:00 pm. You do not need an appointment for the interview!  °[facility] [address], [place], [place] 336-631-2330   °[facility]  336-342-8273   °[facility]  336-703-3100   °[facility]  336-570-6415   ° °Behavioral Health Resources in the Community: °Intensive Outpatient Programs °Organization         Address  Phone  Notes  °[facility] [address], [place], [place] 336-878-6098   °[facility] [address], [place], [place] 336-832-9800   °[facility] [address], [place] ° 336-882-2125   °[facility] [address],  °[place], [place] 1-800-853-5163 or 336-641-4981   °Substance Abuse Resources °Organization         Address  Phone  Notes  °[facility]  336-882-2125   °[facility]  336-784-9470   °[facility]  336-285-9073   °[facility]  336-845-3988   °Residential & Outpatient Substance Abuse Program  1-800-659-3381   °Psychological Services °Organization         Address  Phone  Notes  °[facility]  336- 832-9600   °[facility]  336- 378-7881    °[facility] [address], [place] 1-800-853-5163 or 336-641-4981   ° °Mobile Crisis Teams °Organization         Address  Phone  Notes  °[facility], Mobile Crisis Care Unit  1-877-626-1772   °Assertive °[facility] ° [address] [place], [place] 336-834-9664   °Sharon DeEsch [address] °[place] 336-554-5454   ° °Self-Help/Support Groups °Organization         Address    Phone             Notes  °[facility] - variety of support groups  336- 373-1402 Call for more information  °Narcotics Anonymous (NA), [facility] [address], °[place]  2 meetings at this location  ° °Residential Treatment Programs °Organization         Address  Phone  Notes  °[facility] [address],    °[place]  1-866-801-8205   °[facility] ° [address], [place], [place] 704-293-8524   °[facility] [address], [place] 336-845-3988 Admissions: 8am-3pm M-F  °[facility] [address]    °[place], [place] 336-841-1104   °[facility] [address], [place], [place] 336-379-7146   °[facility] [address]  °[place], [place] 336-285-9073   °[facility] - Intensive Outpatient [address], [place], [place] 336-852-3033   °[facility] [address]  °[place], [place] 1-877-615-2722 or 336-784-9470   °[facility] [address] [place], [place] 336-227-7417 Accepts Medicaid  °[facility] [address]  °[place] 1-800-659-3381 Substance Abuse/Addiction Treatment  ° °[place] Behavioral Health Resources °Organization         Address  Phone  Notes  °[facility]  (888) 581-9988   °Julie Brannon, PhD [address] [place], [place]   (336) 349-5553 or (336) 951-0000   °[facility]   [address] °[place], [place] (336) 349-4454   °[facility] [address], [place], [place] (336) 342-8316 Insurance/Medicaid/sponsorship through [facility]  °[facility] [address]                                    [place], [place] (336) 342-8316 Therapy/tele-psych/case  °[facility] [address]  ° [place], [place] (336) 349-2233    °Dr. Arfeen  (336) 349-4544   °[facility] [place]  [employer] [place] [facility] 1) [address], [place] °2) [address], [place] °3)  [address], [place] (336) 349-3220 °(336) 342-7768 ° °(336) 342-8140   °[place] [facility] (336) 342-1394 or (336) 342-3537 (After Hours)    ° ° ° °

## 2014-04-24 NOTE — ED Provider Notes (Signed)
CSN: [id number]     Arrival date & time 04/24/14  0235 History   First MD Initiated Contact with Patient 04/24/14 0300     Chief Complaint  Patient presents with  . Vaginal Discharge     (Consider location/radiation/quality/duration/timing/severity/associated sxs/prior Treatment) HPI  Rozina Lanna PocheJ Grewell is a 23 y.o. female complaining of perineal irritation and itching, she states that she has pain in the area as well. Patient noticed yellowish vaginal discharge today. The discomfort has been going on for about 5 days. Patient states that  at a hotel and using unknown soap but she thinks that irritated her. She denies dysuria, hematuria, concern about STDs, abdominal pain, fever, chills, nausea, vomiting. Patient used Monistat yesterday with little relief. States she was treated for BV 2 weeks ago. She presented for her yearly exam, she had no symptoms before the exam 2 weeks ago, she had full STD screening at that time. Patient states that she was compliant with Flagyl which she thinks she took for 7 days.  Past Medical History  Diagnosis Date  . Asthma   . Strep throat    History reviewed. No pertinent past surgical history. Family History  Problem Relation Age of Onset  . Cancer Mother    History  Substance Use Topics  . Smoking status: Never Smoker   . Smokeless tobacco: Never Used  . Alcohol Use: Yes     Comment: occasional per pt   OB History   Grav Para Term Preterm Abortions TAB SAB Ect Mult Living                 Review of Systems  10 systems reviewed and found to be negative, except as noted in the HPI.   Allergies  Review of patient's allergies indicates no known allergies.  Home Medications   Prior to Admission medications   Medication Sig Start Date End Date Taking? Authorizing Provider  albuterol (PROVENTIL HFA;VENTOLIN HFA) 108 (90 BASE) MCG/ACT inhaler Inhale 1-2 puffs into the lungs every 6 (six) hours as needed for wheezing or shortness of breath.   Yes  Historical Provider, MD  ibuprofen (ADVIL,MOTRIN) 200 MG tablet Take 600 mg by mouth every 6 (six) hours as needed for mild pain or moderate pain.   Yes Historical Provider, MD   BP 113/63  Pulse 64  Temp(Src) 97.7 F (36.5 C) (Oral)  Resp 16  SpO2 100%  LMP 04/08/2014 Physical Exam  Nursing note and vitals reviewed. Constitutional: She is oriented to person, place, and time. She appears well-developed and well-nourished. No distress.  HENT:  Head: Normocephalic.  Mouth/Throat: Oropharynx is clear and moist.  Eyes: Conjunctivae and EOM are normal. Pupils are equal, round, and reactive to light.  Cardiovascular: Normal rate, regular rhythm and intact distal pulses.   Pulmonary/Chest: Effort normal and breath sounds normal. No stridor. No respiratory distress. She has no wheezes. She has no rales. She exhibits no tenderness.  Abdominal: Soft. Bowel sounds are normal. She exhibits no distension and no mass. There is no tenderness. There is no rebound and no guarding.  Genitourinary:  Pelvic exam a chaperoned by technician: No rashes or lesions, there is a thick, opaque, yellow vaginal discharge, no cervical motion or adnexal tenderness.  Musculoskeletal: Normal range of motion.  Neurological: She is alert and oriented to person, place, and time.  Psychiatric: She has a normal mood and affect.    ED Course  Procedures (including critical care time) Labs Review Labs Reviewed  WET PREP, GENITAL -  Abnormal; Notable for the following:    WBC, Wet Prep HPF POC TOO NUMEROUS TO COUNT (*)    All other components within normal limits  URINALYSIS, ROUTINE W REFLEX MICROSCOPIC - Abnormal; Notable for the following:    APPearance CLOUDY (*)    Hgb urine dipstick TRACE (*)    Leukocytes, UA LARGE (*)    All other components within normal limits  URINE MICROSCOPIC-ADD ON - Abnormal; Notable for the following:    Squamous Epithelial / LPF FEW (*)    All other components within normal limits   GC/CHLAMYDIA PROBE AMP  PREGNANCY, URINE    Imaging Review No results found.   EKG Interpretation None      MDM   Final diagnoses:  Vaginal discharge    Filed Vitals:   04/24/14 0241 04/24/14 0435  BP: 118/62 113/63  Pulse: 85 64  Temp: 97.8 F (36.6 C) 97.7 F (36.5 C)  TempSrc: Oral Oral  Resp: 20 16  SpO2: 100% 100%    Medications  cefTRIAXone (ROCEPHIN) injection 250 mg (250 mg Intramuscular Given 04/24/14 0511)  azithromycin (ZITHROMAX) tablet 1,000 mg (1,000 mg Oral Given 04/24/14 0511)  lidocaine (XYLOCAINE) 1 % (with pres) injection (0.9 mLs  Given 04/24/14 0512)    Leim Fabryia J Colucci is a 23 y.o. female presenting with perineal irritation and itching, she also has yellowish vaginal discharge. Abdominal exam is benign. Pelvic exam shows no rashes or lesions, thick yellow vaginal discharge with no cervical motion or adnexal tenderness.  Wet prep with too numerous to count white blood cells. Clinically, this is not consistent with PID, there is no cervical motion or adnexal tenderness.. I discussed findings and offered treatment for GC/Clamydia today in the ED. I explained to Pt that results of GC/Chlamidia testing are pending and that they may come back negative. Discussed pros and cons of treatment. Pt opted for treatment today. Pt will be given 250mg  of rocephin IM and 1g Azithromycin PO.   Evaluation does not show pathology that would require ongoing emergent intervention or inpatient treatment. Pt is hemodynamically stable and mentating appropriately. Discussed findings and plan with patient/guardian, who agrees with care plan. All questions answered. Return precautions discussed and outpatient follow up given.        Wynetta Emeryicole Analyssa Downs, PA-C 04/24/14 [phone]

## 2014-04-26 LAB — GC/CHLAMYDIA PROBE AMP
CT Probe RNA: NEGATIVE
GC Probe RNA: NEGATIVE

## 2014-05-28 ENCOUNTER — Emergency Department (HOSPITAL_COMMUNITY)
Admission: EM | Admit: 2014-05-28 | Discharge: 2014-05-29 | Disposition: A | Discharge status: Home / Self Care | Payer: BC Managed Care – PPO | Attending: Emergency Medicine | Admitting: Emergency Medicine

## 2014-05-28 ENCOUNTER — Encounter (HOSPITAL_COMMUNITY): Payer: Self-pay | Admitting: Emergency Medicine

## 2014-05-28 DIAGNOSIS — Z3202 Encounter for pregnancy test, result negative: Secondary | ICD-10-CM | POA: Insufficient documentation

## 2014-05-28 DIAGNOSIS — J45909 Unspecified asthma, uncomplicated: Secondary | ICD-10-CM | POA: Insufficient documentation

## 2014-05-28 DIAGNOSIS — B373 Candidiasis of vulva and vagina: Secondary | ICD-10-CM | POA: Insufficient documentation

## 2014-05-28 DIAGNOSIS — Z79899 Other long term (current) drug therapy: Secondary | ICD-10-CM | POA: Insufficient documentation

## 2014-05-28 DIAGNOSIS — Z791 Long term (current) use of non-steroidal anti-inflammatories (NSAID): Secondary | ICD-10-CM | POA: Insufficient documentation

## 2014-05-28 DIAGNOSIS — B3731 Acute candidiasis of vulva and vagina: Secondary | ICD-10-CM

## 2014-05-28 NOTE — ED Notes (Signed)
Pt. Made aware for the need of urine. 

## 2014-05-28 NOTE — ED Notes (Signed)
Pt presents with c/o vaginal irritation onset today, denies discharge,odor or bleeding. Denies urinary s/s

## 2014-05-28 NOTE — ED Provider Notes (Signed)
CSN: [id number]     Arrival date & time 05/28/14  2256 History   First MD Initiated Contact with Patient 05/28/14 2324     Chief Complaint  Patient presents with  . Vaginal Itching     (Consider location/radiation/quality/duration/timing/severity/associated sxs/prior Treatment) HPI Pt is a 23yo female presenting to ED with c/o vaginal irritation and "soreness" that pt states is "not that bad" but ranks a 7/10 on pain score. Symptoms started earlier today. She has not tried anything for pain. States she just gets nervous when things are not "right" with her body. Denies vaginal discharge, odor or vaginal bleeding. Pt states she has a hx of yeast infections but does not think this is one as she has not had any discharge. Denies recent use of antibiotics, new soaps or lotions. Denies trauma to area. Denies fever, chills, n/v/d. Denies concern for STD as she recently tested and treated in ED 04/24/14.  LMP: 05/11/14   Past Medical History  Diagnosis Date  . Asthma   . Strep throat    History reviewed. No pertinent past surgical history. Family History  Problem Relation Age of Onset  . Cancer Mother    History  Substance Use Topics  . Smoking status: Never Smoker   . Smokeless tobacco: Never Used  . Alcohol Use: Yes     Comment: occasional per pt   OB History    No data available     Review of Systems  Constitutional: Negative for fever and chills.  Respiratory: Negative for cough and shortness of breath.   Gastrointestinal: Negative for nausea, vomiting, abdominal pain and diarrhea.  Genitourinary: Positive for vaginal pain ( irritation). Negative for dysuria, urgency, hematuria, flank pain, vaginal bleeding, vaginal discharge, genital sores and pelvic pain.  All other systems reviewed and are negative.     Allergies  Review of patient's allergies indicates no known allergies.  Home Medications   Prior to Admission medications   Medication Sig Start Date End Date Taking?  Authorizing Provider  ibuprofen (ADVIL,MOTRIN) 200 MG tablet Take 600 mg by mouth every 6 (six) hours as needed for mild pain or moderate pain.   Yes Historical Provider, MD  albuterol (PROVENTIL HFA;VENTOLIN HFA) 108 (90 BASE) MCG/ACT inhaler Inhale 1-2 puffs into the lungs every 6 (six) hours as needed for wheezing or shortness of breath.    Historical Provider, MD  fluconazole (DIFLUCAN) 200 MG tablet Take 1 tablet (200 mg total) by mouth daily. Take 1 tab every 3 days x3 if symptoms not improved after 1st dose 05/29/14   Junius FinnerErin O'Malley, PA-C   BP 118/64 mmHg  Pulse 92  Temp(Src) 97.4 F (36.3 C) (Oral)  Resp 20  Ht 5\' 6"  (1.676 m)  Wt 240 lb (108.863 kg)  BMI 38.76 kg/m2  SpO2 100%  LMP 05/11/2014 Physical Exam  Constitutional: She appears well-developed and well-nourished. No distress.  Pt sitting on exam bed. Appears well, non-toxic. NAD  HENT:  Head: Normocephalic and atraumatic.  Eyes: Conjunctivae are normal. No scleral icterus.  Neck: Normal range of motion.  Cardiovascular: Normal rate, regular rhythm and normal heart sounds.   Pulmonary/Chest: Effort normal and breath sounds normal. No respiratory distress. She has no wheezes. She has no rales. She exhibits no tenderness.  Abdominal: Soft. Bowel sounds are normal. She exhibits no distension and no mass. There is no tenderness. There is no rebound and no guarding.  Soft, non-distended, non-tender. No CVAT  Genitourinary:  Chaperoned exam. Normal external genitalia w/o lesions,  rashes or bruising.  Vaginal canal-moderate amount of white-yellow thick discharge. No vaginal bleeding. No CMT, adnexal tenderness or masses.   Musculoskeletal: Normal range of motion.  Neurological: She is alert.  Skin: Skin is warm and dry. She is not diaphoretic.  Nursing note and vitals reviewed.   ED Course  Procedures (including critical care time) Labs Review Labs Reviewed  WET PREP, GENITAL - Abnormal; Notable for the following:     Yeast Wet Prep HPF POC MANY (*)    Clue Cells Wet Prep HPF POC FEW (*)    WBC, Wet Prep HPF POC MANY (*)    All other components within normal limits  GC/CHLAMYDIA PROBE AMP  POC URINE PREG, ED    Imaging Review No results found.   EKG Interpretation None      MDM   Final diagnoses:  Vaginal yeast infection    Pt presenting to ED with c/o vaginal "discomfort" described as a soreness, 7/10. No pain medication tried PTA. Denies vaginal discharge or bleeding. Pt not concerned for STD due to recent negative testing.  Pt appears well, non-toxic.  Pelvic exam: yellow-white thick discharge. No CMT, adnexal tenderness or masses. Not concerned for ectopic pregnancy, ovarian torsion, or PID Wet prep: many yeast cells Will tx with diflucan.   Advised pt [facility] as [facility] with an OB/GYN for recheck of symptoms if not improving. Home care instructions provided. Return precautions provided. Pt verbalized understanding and agreement with tx plan.     Junius FinnerErin O'Malley, PA-C 05/29/14 [id number]  Suzi RootsKevin E Steinl, MD 05/29/14 [phone]

## 2014-05-29 LAB — POC URINE PREG, ED: Preg Test, Ur: NEGATIVE

## 2014-05-29 LAB — WET PREP, GENITAL: Trich, Wet Prep: NONE SEEN

## 2014-05-29 MED ORDER — FLUCONAZOLE 150 MG PO TABS
150.0000 mg | ORAL_TABLET | Freq: Once | ORAL | Status: AC
  Administered 2014-05-29: 150 mg via ORAL
  Filled 2014-05-29: qty 1

## 2014-05-29 MED ORDER — FLUCONAZOLE 200 MG PO TABS: 200.0000 mg | ORAL_TABLET | Freq: Every day | ORAL | Status: DC

## 2014-05-29 NOTE — Discharge Instructions (Signed)

## 2014-05-31 LAB — GC/CHLAMYDIA PROBE AMP
CT Probe RNA: NEGATIVE
GC Probe RNA: NEGATIVE

## 2014-08-12 ENCOUNTER — Encounter (HOSPITAL_COMMUNITY): Payer: Self-pay | Admitting: Emergency Medicine

## 2014-08-12 ENCOUNTER — Emergency Department (HOSPITAL_COMMUNITY)
Admission: EM | Admit: 2014-08-12 | Discharge: 2014-08-13 | Disposition: A | Discharge status: Home / Self Care | Payer: Self-pay | Attending: Emergency Medicine | Admitting: Emergency Medicine

## 2014-08-12 DIAGNOSIS — Z79899 Other long term (current) drug therapy: Secondary | ICD-10-CM | POA: Insufficient documentation

## 2014-08-12 DIAGNOSIS — Z872 Personal history of diseases of the skin and subcutaneous tissue: Secondary | ICD-10-CM | POA: Insufficient documentation

## 2014-08-12 DIAGNOSIS — Z8709 Personal history of other diseases of the respiratory system: Secondary | ICD-10-CM | POA: Insufficient documentation

## 2014-08-12 DIAGNOSIS — L509 Urticaria, unspecified: Secondary | ICD-10-CM | POA: Insufficient documentation

## 2014-08-12 DIAGNOSIS — R21 Rash and other nonspecific skin eruption: Secondary | ICD-10-CM

## 2014-08-12 DIAGNOSIS — T7840XA Allergy, unspecified, initial encounter: Secondary | ICD-10-CM

## 2014-08-12 DIAGNOSIS — J45909 Unspecified asthma, uncomplicated: Secondary | ICD-10-CM | POA: Insufficient documentation

## 2014-08-12 NOTE — ED Notes (Signed)
Pt st's she thinks she is having allergic reaction.  Pt c/o rash to bil hands, arms, legs and face.  Onset 3 days ago but continues to get worse.  No resp. Problems noted

## 2014-08-12 NOTE — ED Provider Notes (Signed)
CSN: [id number]     Arrival date & time 08/12/14  2207 History  This chart was scribed for non-physician practitioner, Dierdre Forth, PA-C, working with Loren Racer, MD, by Bronson Curb, ED Scribe. This patient was seen in room TR06C/TR06C and the patient's care was started at 11:55 PM.   Chief Complaint  Patient presents with  . Rash    The history is provided by the patient and medical records. No language interpreter was used.     HPI Comments: Wendy Morton is a 24 y.o. female who presents to the Emergency Department complaining of worsening, itchy rash to the bilateral arms, legs, and feet the past 4 days, that has recently spread to the face in the last 24 hours. Patient suspects she is having an allergic reaction. There is associated facial redness. She reports history of the same, 4 years ago, that was thought to be related to tomatoes, however, patient notes she eats spaghetti and pizza without any complications. She reports she was discharged without a prescription. She reports she later returned to her PCP for worsening symptoms where she was then sent to ED for an anaphylactic reaction and was given an injection (unable to find any evidence of this in medical records)  She reports history of eczema, but states this does not look or feel similar to today's rash or the previous incident's rash. Patient has tried cortisone cream, however, she this only caused the rash to burn. She denies facial swelling, throat swelling, SOB, nausea, vomiting.   Past Medical History  Diagnosis Date  . Asthma   . Strep throat    History reviewed. No pertinent past surgical history. Family History  Problem Relation Age of Onset  . Cancer Mother    History  Substance Use Topics  . Smoking status: Never Smoker   . Smokeless tobacco: Never Used  . Alcohol Use: Yes     Comment: occasional per pt   OB History    No data available     Review of Systems  Constitutional: Negative for  fever, chills, diaphoresis, appetite change, fatigue and unexpected weight change.  HENT: Negative for facial swelling, mouth sores and trouble swallowing.   Eyes: Negative for visual disturbance.  Respiratory: Negative for cough, chest tightness, shortness of breath and wheezing.   Cardiovascular: Negative for chest pain.  Gastrointestinal: Negative for nausea, vomiting, abdominal pain, diarrhea and constipation.  Musculoskeletal: Negative for back pain and neck stiffness.  Skin: Positive for color change and rash.  Allergic/Immunologic: Negative for immunocompromised state.  Neurological: Negative for syncope, light-headedness and headaches.  Hematological: Does not bruise/bleed easily.  Psychiatric/Behavioral: Negative for sleep disturbance. The patient is not nervous/anxious.       Allergies  Review of patient's allergies indicates no known allergies.  Home Medications   Prior to Admission medications   Medication Sig Start Date End Date Taking? Authorizing Provider  albuterol (PROVENTIL HFA;VENTOLIN HFA) 108 (90 BASE) MCG/ACT inhaler Inhale 1-2 puffs into the lungs every 6 (six) hours as needed for wheezing or shortness of breath.    Historical Provider, MD  diphenhydrAMINE (BENADRYL) 25 MG tablet Take 1 tablet (25 mg total) by mouth every 6 (six) hours as needed for itching (Rash). 08/13/14   Safiyyah Vasconez, PA-C  EPINEPHrine (EPIPEN 2-PAK) 0.3 mg/0.3 mL IJ SOAJ injection Inject 0.3 mLs (0.3 mg total) into the muscle once as needed (for severe allergic reaction). CAll 911 immediately if you have to use this medicine 08/13/14   [facility],  PA-C  famotidine (PEPCID) 20 MG tablet Take 1 tablet (20 mg total) by mouth 2 (two) times daily. 08/13/14   Starlet Gallentine, PA-C  fluconazole (DIFLUCAN) 200 MG tablet Take 1 tablet (200 mg total) by mouth daily. Take 1 tab every 3 days x3 if symptoms not improved after 1st dose 05/29/14   Junius FinnerErin O'Malley, PA-C  hydrocortisone 2.5 %  lotion Apply topically 2 (two) times daily. 08/13/14   Nekita Pita, PA-C  ibuprofen (ADVIL,MOTRIN) 200 MG tablet Take 600 mg by mouth every 6 (six) hours as needed for mild pain or moderate pain.    Historical Provider, MD  predniSONE (DELTASONE) 20 MG tablet Take 2 tablets (40 mg total) by mouth daily. 08/13/14   Dahlia ClientHannah Naiara Lombardozzi, PA-C   Triage Vitals: BP 110/67 mmHg  Pulse 72  Temp(Src) 97.8 F (36.6 C) (Oral)  Resp 16  SpO2 98%  LMP 07/31/2014  Physical Exam  Constitutional: She is oriented to person, place, and time. She appears well-developed and well-nourished. No distress.  HENT:  Head: Normocephalic and atraumatic.  Right Ear: Tympanic membrane, external ear and ear canal normal.  Left Ear: Tympanic membrane, external ear and ear canal normal.  Nose: Nose normal. No mucosal edema or rhinorrhea.  Mouth/Throat: Uvula is midline. No uvula swelling. No oropharyngeal exudate, posterior oropharyngeal edema, posterior oropharyngeal erythema or tonsillar abscesses.  No swelling of the uvula or oropharynx   Eyes: Conjunctivae are normal.  Neck: Normal range of motion.  Patent airway No stridor; normal phonation Handling secretions without difficulty  Cardiovascular: Normal rate, normal heart sounds and intact distal pulses.   No murmur heard. Pulmonary/Chest: Effort normal and breath sounds normal. No stridor. No respiratory distress. She has no wheezes.  No wheezes or rhonchi  Abdominal: Soft. Bowel sounds are normal. There is no tenderness.  Musculoskeletal: Normal range of motion. She exhibits no edema.  Neurological: She is alert and oriented to person, place, and time.  Skin: Skin is warm and dry. Rash noted. She is not diaphoretic.  Scattered patches of erythematous papules without distinct urticaria noted over the entirety of patient's body including her face and some on her lips without associated edema, no weeping or bulla Mild excoriations - no induration or  fluctuance to indicate secondary infection  Psychiatric: She has a normal mood and affect.  Nursing note and vitals reviewed.   ED Course  Procedures (including critical care time)  DIAGNOSTIC STUDIES: Oxygen Saturation is 98% on room air, normal by my interpretation.    COORDINATION OF CARE: At 0002 Discussed treatment plan with patient which includes Benadryl, Pepcid, and prednisone. Patient agrees.   Labs Review Labs Reviewed - No data to display  Imaging Review No results found.   EKG Interpretation None      MDM   Final diagnoses:  Allergic reaction, initial encounter  Rash and nonspecific skin eruption   Seynabou J Castronova resents with complaints of rash, itching and burning and history of allergic reaction. She has no known triggers for this. She reports that it is not her eczema. She has tried hydrocortisone without relief. Will give allergy cocktail and reassess. No evidence of throat closing, no stridor, patient handling secretions without difficulty.  1:22 AM He reports improvement in her rash and itching. Scratching is in her throat has resolved. She remains without stridor or swelling of the face.  Patient re-evaluated prior to dc, is hemodynamically stable, in no respiratory distress, and denies the feeling of throat closing. Pt has been advised  to take OTC benadryl & return to the ED if they have a mod-severe allergic rxn (s/s including throat closing, difficulty breathing, swelling of lips face or tongue). Pt is to follow up with their PCP. Pt is agreeable with plan & verbalizes understanding.  I have personally reviewed patient's vitals, nursing note and any pertinent labs or imaging.  I performed an undressed physical exam.    It has been determined that no acute conditions requiring further emergency intervention are present at this time. The patient/guardian have been advised of the diagnosis and plan. I reviewed all labs and imaging including any potential  incidental findings. We have discussed signs and symptoms that warrant return to the ED and they are listed in the discharge instructions.    Vital signs are stable at discharge.   BP 110/67 mmHg  Pulse 72  Temp(Src) 97.8 F (36.6 C) (Oral)  Resp 16  SpO2 98%  LMP 07/31/2014  I personally performed the services described in this documentation, which was scribed in my presence. The recorded information has been reviewed and is accurate.   Dahlia Client Avarae Zwart, PA-C 08/13/14 0125  Richardean Canal, MD 08/15/14 [phone]

## 2014-08-13 MED ORDER — PREDNISONE 20 MG PO TABS: 40.0000 mg | ORAL_TABLET | Freq: Every day | ORAL | Status: DC

## 2014-08-13 MED ORDER — DIPHENHYDRAMINE HCL 25 MG PO TABS: 25.0000 mg | ORAL_TABLET | Freq: Four times a day (QID) | ORAL | Status: DC | PRN

## 2014-08-13 MED ORDER — FAMOTIDINE 20 MG PO TABS: 20.0000 mg | ORAL_TABLET | Freq: Two times a day (BID) | ORAL | Status: DC

## 2014-08-13 MED ORDER — PREDNISONE 20 MG PO TABS
60.0000 mg | ORAL_TABLET | Freq: Once | ORAL | Status: AC
  Administered 2014-08-13: 60 mg via ORAL
  Filled 2014-08-13: qty 3

## 2014-08-13 MED ORDER — EPINEPHRINE 0.3 MG/0.3ML IJ SOAJ: 0.3000 mg | Freq: Once | INTRAMUSCULAR | Status: AC | PRN

## 2014-08-13 MED ORDER — HYDROCORTISONE 2.5 % EX LOTN: TOPICAL_LOTION | Freq: Two times a day (BID) | CUTANEOUS | Status: DC

## 2014-08-13 MED ORDER — FAMOTIDINE 20 MG PO TABS
20.0000 mg | ORAL_TABLET | Freq: Once | ORAL | Status: AC
  Administered 2014-08-13: 20 mg via ORAL
  Filled 2014-08-13: qty 1

## 2014-08-13 MED ORDER — DIPHENHYDRAMINE HCL 25 MG PO CAPS
50.0000 mg | ORAL_CAPSULE | Freq: Once | ORAL | Status: AC
  Administered 2014-08-13: 50 mg via ORAL
  Filled 2014-08-13: qty 2

## 2014-08-13 NOTE — Discharge Instructions (Signed)
1. Medications: Prednisone, Benadryl, Pepcid, hydrocortisone lotion, Epi Pen, usual home medications °2. Treatment: rest, drink plenty of fluids, take medications as prescribed °3. Follow Up: Please followup with your primary doctor in 3 days for discussion of your diagnoses and further evaluation after today's visit; if you do not have a primary care doctor use the resource guide provided to find one; followup with dermatology as needed; Return to the ER for difficulty breathing, return of allergic reaction or other concerning symptoms ° °

## 2014-10-24 ENCOUNTER — Emergency Department (HOSPITAL_COMMUNITY)
Admission: EM | Admit: 2014-10-24 | Discharge: 2014-10-24 | Disposition: A | Discharge status: Home / Self Care | Payer: Self-pay | Attending: Emergency Medicine | Admitting: Emergency Medicine

## 2014-10-24 ENCOUNTER — Encounter (HOSPITAL_COMMUNITY): Payer: Self-pay | Admitting: Emergency Medicine

## 2014-10-24 DIAGNOSIS — Z79899 Other long term (current) drug therapy: Secondary | ICD-10-CM | POA: Insufficient documentation

## 2014-10-24 DIAGNOSIS — Z8659 Personal history of other mental and behavioral disorders: Secondary | ICD-10-CM | POA: Insufficient documentation

## 2014-10-24 DIAGNOSIS — J45909 Unspecified asthma, uncomplicated: Secondary | ICD-10-CM | POA: Insufficient documentation

## 2014-10-24 DIAGNOSIS — Y9389 Activity, other specified: Secondary | ICD-10-CM | POA: Insufficient documentation

## 2014-10-24 DIAGNOSIS — Y998 Other external cause status: Secondary | ICD-10-CM | POA: Insufficient documentation

## 2014-10-24 DIAGNOSIS — Y9289 Other specified places as the place of occurrence of the external cause: Secondary | ICD-10-CM | POA: Insufficient documentation

## 2014-10-24 DIAGNOSIS — T07XXXA Unspecified multiple injuries, initial encounter: Secondary | ICD-10-CM

## 2014-10-24 DIAGNOSIS — S0083XA Contusion of other part of head, initial encounter: Secondary | ICD-10-CM | POA: Insufficient documentation

## 2014-10-24 DIAGNOSIS — S00531A Contusion of lip, initial encounter: Secondary | ICD-10-CM | POA: Insufficient documentation

## 2014-10-24 DIAGNOSIS — S1083XA Contusion of other specified part of neck, initial encounter: Secondary | ICD-10-CM | POA: Insufficient documentation

## 2014-10-24 DIAGNOSIS — Z7952 Long term (current) use of systemic steroids: Secondary | ICD-10-CM | POA: Insufficient documentation
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
## 2014-10-24 NOTE — ED Notes (Signed)
Dr. Wentz at the bedside.  
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
## 2014-10-24 NOTE — Discharge Instructions (Signed)
Use ice on the sore spots today and tomorrow. Take Tylenol or Motrin for pain. Return here if needed, for problems.    Contusion A contusion is a deep bruise. Contusions are the result of an injury that caused bleeding under the skin. The contusion may turn blue, purple, or yellow. Minor injuries will give you a painless contusion, but more severe contusions may stay painful and swollen for a few weeks.  CAUSES  A contusion is usually caused by a blow, trauma, or direct force to an area of the body. SYMPTOMS   Swelling and redness of the injured area.  Bruising of the injured area.  Tenderness and soreness of the injured area.  Pain. DIAGNOSIS  The diagnosis can be made by taking a history and physical exam. An X-ray, CT scan, or MRI may be needed to determine if there were any associated injuries, such as fractures. TREATMENT  Specific treatment will depend on what area of the body was injured. In general, the best treatment for a contusion is resting, icing, elevating, and applying cold compresses to the injured area. Over-the-counter medicines may also be recommended for pain control. Ask your caregiver what the best treatment is for your contusion. HOME CARE INSTRUCTIONS   Put ice on the injured area.  Put ice in a plastic bag.  Place a towel between your skin and the bag.  Leave the ice on for 15-20 minutes, 3-4 times a day, or as directed by your health care provider.  Only take over-the-counter or prescription medicines for pain, discomfort, or fever as directed by your caregiver. Your caregiver may recommend avoiding anti-inflammatory medicines (aspirin, ibuprofen, and naproxen) for 48 hours because these medicines may increase bruising.  Rest the injured area.  If possible, elevate the injured area to reduce swelling. SEEK IMMEDIATE MEDICAL CARE IF:   You have increased bruising or swelling.  You have pain that is getting worse.  Your swelling or pain is not  relieved with medicines. MAKE SURE YOU:   Understand these instructions.  Will watch your condition.  Will get help right away if you are not doing well or get worse. Document Released: 04/04/2005 Document Revised: 06/30/2013 Document Reviewed: 04/30/2011 [facility] Patient Information 2015 [employer] [place]. This information is not intended to replace advice given to you by your health care provider. Make sure you discuss any questions you have with your health care provider.

## 2014-12-04 ENCOUNTER — Emergency Department (HOSPITAL_COMMUNITY)
Admission: EM | Admit: 2014-12-04 | Discharge: 2014-12-04 | Disposition: A | Discharge status: Home / Self Care | Payer: Self-pay | Attending: Emergency Medicine | Admitting: Emergency Medicine

## 2014-12-04 ENCOUNTER — Encounter (HOSPITAL_COMMUNITY): Payer: Self-pay | Admitting: *Deleted

## 2014-12-04 DIAGNOSIS — B379 Candidiasis, unspecified: Secondary | ICD-10-CM

## 2014-12-04 DIAGNOSIS — N390 Urinary tract infection, site not specified: Secondary | ICD-10-CM

## 2014-12-04 DIAGNOSIS — B373 Candidiasis of vulva and vagina: Secondary | ICD-10-CM | POA: Insufficient documentation

## 2014-12-04 DIAGNOSIS — J45909 Unspecified asthma, uncomplicated: Secondary | ICD-10-CM | POA: Insufficient documentation

## 2014-12-04 DIAGNOSIS — Z8709 Personal history of other diseases of the respiratory system: Secondary | ICD-10-CM | POA: Insufficient documentation

## 2014-12-04 DIAGNOSIS — Z3202 Encounter for pregnancy test, result negative: Secondary | ICD-10-CM | POA: Insufficient documentation

## 2014-12-04 DIAGNOSIS — L293 Anogenital pruritus, unspecified: Secondary | ICD-10-CM | POA: Insufficient documentation

## 2014-12-04 DIAGNOSIS — Z79899 Other long term (current) drug therapy: Secondary | ICD-10-CM | POA: Insufficient documentation

## 2014-12-04 DIAGNOSIS — N898 Other specified noninflammatory disorders of vagina: Secondary | ICD-10-CM

## 2014-12-04 DIAGNOSIS — Z7952 Long term (current) use of systemic steroids: Secondary | ICD-10-CM | POA: Insufficient documentation

## 2014-12-04 LAB — URINALYSIS, ROUTINE W REFLEX MICROSCOPIC
BILIRUBIN URINE: NEGATIVE
GLUCOSE, UA: NEGATIVE mg/dL
Ketones, ur: NEGATIVE mg/dL
Nitrite: NEGATIVE
Protein, ur: NEGATIVE mg/dL
SPECIFIC GRAVITY, URINE: 1.021 (ref 1.005–1.030)
UROBILINOGEN UA: 0.2 mg/dL (ref 0.0–1.0)
pH: 6 (ref 5.0–8.0)

## 2014-12-04 LAB — POC URINE PREG, ED: PREG TEST UR: NEGATIVE

## 2014-12-04 LAB — WET PREP, GENITAL
Clue Cells Wet Prep HPF POC: NONE SEEN
TRICH WET PREP: NONE SEEN
Yeast Wet Prep HPF POC: NONE SEEN

## 2014-12-04 LAB — URINE MICROSCOPIC-ADD ON

## 2014-12-04 MED ORDER — CEPHALEXIN 500 MG PO CAPS: 500.0000 mg | ORAL_CAPSULE | Freq: Three times a day (TID) | ORAL | Status: DC

## 2014-12-04 MED ORDER — FLUCONAZOLE 150 MG PO TABS: 150.0000 mg | ORAL_TABLET | Freq: Once | ORAL | Status: DC

## 2014-12-04 NOTE — Discharge Instructions (Signed)
Take the prescribed medication as directed. [facility]concerns. Return to the ED for new or worsening symptoms.

## 2014-12-04 NOTE — ED Provider Notes (Signed)
CSN: [id number]     Arrival date & time 12/04/14  1415 History   First MD Initiated Contact with Patient 12/04/14 1420     Chief Complaint  Patient presents with  . Vaginal Discharge     (Consider location/radiation/quality/duration/timing/severity/associated sxs/prior Treatment) Patient is a 24 y.o. female presenting with vaginal discharge. The history is provided by the patient and medical records.  Vaginal Discharge Associated symptoms: dysuria    This is a 24 year old female with past medical history significant for asthma, presenting to the ED for vaginal irritation and itching. Patient states "I think I held my urine too long". She reports some mild dysuria. She denies any vaginal discharge. No pelvic pain, abdominal pain, fever, chills, nausea, vomiting, or diarrhea. Patient states usually when she has the symptoms she has a yeast infection and is easily treated with Diflucan. She denies any new sexual partners or concern for STD at this time. She has no personal history of STD.  Past Medical History  Diagnosis Date  . Asthma   . Strep throat    No past surgical history on file. Family History  Problem Relation Age of Onset  . Cancer Mother    History  Substance Use Topics  . Smoking status: Never Smoker   . Smokeless tobacco: Never Used  . Alcohol Use: Yes     Comment: occasional per pt   OB History    No data available     Review of Systems  Genitourinary: Positive for dysuria and vaginal discharge.  All other systems reviewed and are negative.     Allergies  Tomato  Home Medications   Prior to Admission medications   Medication Sig Start Date End Date Taking? Authorizing Provider  albuterol (PROVENTIL HFA;VENTOLIN HFA) 108 (90 BASE) MCG/ACT inhaler Inhale 1-2 puffs into the lungs every 6 (six) hours as needed for wheezing or shortness of breath.    Historical Provider, MD  diphenhydrAMINE (BENADRYL) 25 MG tablet Take 1 tablet (25 mg total) by mouth every  6 (six) hours as needed for itching (Rash). 08/13/14   Hannah Muthersbaugh, PA-C  EPINEPHrine (EPIPEN 2-PAK) 0.3 mg/0.3 mL IJ SOAJ injection Inject 0.3 mLs (0.3 mg total) into the muscle once as needed (for severe allergic reaction). CAll 911 immediately if you have to use this medicine 08/13/14   [facility], [facility]  famotidine (PEPCID) 20 MG tablet Take 1 tablet (20 mg total) by mouth 2 (two) times daily. 08/13/14   Hannah Muthersbaugh, PA-C  fluconazole (DIFLUCAN) 200 MG tablet Take 1 tablet (200 mg total) by mouth daily. Take 1 tab every 3 days x3 if symptoms not improved after 1st dose 05/29/14   Junius FinnerErin O'Malley, PA-C  hydrocortisone 2.5 % lotion Apply topically 2 (two) times daily. 08/13/14   Hannah Muthersbaugh, PA-C  ibuprofen (ADVIL,MOTRIN) 200 MG tablet Take 600 mg by mouth every 6 (six) hours as needed for mild pain or moderate pain.    Historical Provider, MD  predniSONE (DELTASONE) 20 MG tablet Take 2 tablets (40 mg total) by mouth daily. 08/13/14   Hannah Muthersbaugh, PA-C   BP 123/73 mmHg  Pulse 80  Temp(Src) 98.1 F (36.7 C) (Oral)  Resp 18  Wt 235 lb (106.595 kg)  SpO2 100%  LMP 11/09/2014   Physical Exam  Constitutional: She is oriented to person, place, and time. She appears well-developed and well-nourished. No distress.  HENT:  Head: Normocephalic and atraumatic.  Mouth/Throat: Oropharynx is clear and moist.  Eyes: Conjunctivae and EOM are normal.  Pupils are equal, round, and reactive to light.  Neck: Normal range of motion. Neck supple.  Cardiovascular: Normal rate, regular rhythm and normal heart sounds.   Pulmonary/Chest: Effort normal and breath sounds normal. No respiratory distress. She has no wheezes.  Abdominal: Soft. Bowel sounds are normal. There is no tenderness. There is no guarding.  Genitourinary: There is no lesion on the right labia. There is no lesion on the left labia. Vaginal discharge found.  Normal female external genitalia without visible lesions;  moderate amount of thick, curd-like vaginal discharge, no bleeding noted; no adnexal or CMT  Musculoskeletal: Normal range of motion. She exhibits no edema.  Neurological: She is alert and oriented to person, place, and time.  Skin: Skin is warm and dry. She is not diaphoretic.  Psychiatric: She has a normal mood and affect.  Nursing note and vitals reviewed.   ED Course  Procedures (including critical care time) Labs Review Labs Reviewed  WET PREP, GENITAL - Abnormal; Notable for the following:    WBC, Wet Prep HPF POC MODERATE (*)    All other components within normal limits  URINALYSIS, ROUTINE W REFLEX MICROSCOPIC (NOT AT [facility]) - Abnormal; Notable for the following:    APPearance CLOUDY (*)    Hgb urine dipstick SMALL (*)    Leukocytes, UA LARGE (*)    All other components within normal limits  URINE MICROSCOPIC-ADD ON - Abnormal; Notable for the following:    Squamous Epithelial / LPF MANY (*)    Bacteria, UA MANY (*)    All other components within normal limits  POC URINE PREG, ED  GC/CHLAMYDIA PROBE AMP () NOT AT [facility]    Imaging Review No results found.   EKG Interpretation None      MDM   Final diagnoses:  Vaginal itching  Yeast infection  UTI (lower urinary tract infection)   24 year old female here with vaginal discomfort/itching and dysuria.  No abdominal pain and exam is benign.  Pelvic exam with moderate amount of curd-like vaginal discharge consistent with yeast infection.  No adnexal or CMT.  Gc/chl pending.  Urine preg negative.  U/a appears infectious-- yeast noted.  Patient will be treated with keflex and diflucan.  [facility].  Discussed plan with patient, he/she acknowledged understanding and agreed with plan of care.  Return precautions given for new or worsening symptoms.  Garlon Hatchet, PA-C 12/04/14 1543  Purvis Sheffield, MD 12/05/14 [phone]

## 2014-12-04 NOTE — ED Notes (Signed)
C/o vaginal discomfort. C/o itching no discharge

## 2014-12-07 LAB — GC/CHLAMYDIA PROBE AMP (~~LOC~~) NOT AT ARMC
Chlamydia: NEGATIVE
Neisseria Gonorrhea: NEGATIVE

## 2014-12-21 ENCOUNTER — Emergency Department (HOSPITAL_COMMUNITY)
Admission: EM | Admit: 2014-12-21 | Discharge: 2014-12-22 | Disposition: A | Discharge status: Home / Self Care | Payer: Self-pay | Attending: Emergency Medicine | Admitting: Emergency Medicine

## 2014-12-21 DIAGNOSIS — H9203 Otalgia, bilateral: Secondary | ICD-10-CM | POA: Insufficient documentation

## 2014-12-21 DIAGNOSIS — J45909 Unspecified asthma, uncomplicated: Secondary | ICD-10-CM | POA: Insufficient documentation

## 2014-12-21 DIAGNOSIS — J029 Acute pharyngitis, unspecified: Secondary | ICD-10-CM | POA: Insufficient documentation

## 2014-12-21 NOTE — ED Notes (Signed)
Pt states that her friend has a sore throat and she thinks she may be catching it as she now has one. Alert and oriented. Afebrile.

## 2014-12-21 NOTE — ED Provider Notes (Signed)
CSN: [id number]     Arrival date & time 12/21/14  2222 History  This chart was scribed for non-physician practitioner Elpidio Anis, PA-C, working with Linwood Dibbles, MD, by Tanda Rockers, ED Scribe. This patient was seen in room WTR7/WTR7 and the patient's care was started at 11:18 PM.    Chief Complaint  Patient presents with  . Sore Throat   The history is provided by the patient. No language interpreter was used.     HPI Comments: Wendy Morton is a 24 y.o. female who presents to the Emergency Department complaining of sore throat x 1 week. Pt also complains of bilateral ear pain and mild fever. Pt has had recent sick contact with similar symptoms. Denies any other associated symptoms.   Past Medical History  Diagnosis Date  . Asthma   . Strep throat    No past surgical history on file. Family History  Problem Relation Age of Onset  . Cancer Mother    History  Substance Use Topics  . Smoking status: Never Smoker   . Smokeless tobacco: Never Used  . Alcohol Use: Yes     Comment: occasional per pt   OB History    No data available     Review of Systems  Constitutional: Positive for fever. Negative for chills.  HENT: Positive for ear pain and sore throat. Negative for congestion.   Respiratory: Negative for cough.   Gastrointestinal: Negative for nausea and vomiting.  Musculoskeletal: Negative for myalgias.  Skin: Negative for rash.      Allergies  Tomato  Home Medications   Prior to Admission medications   Medication Sig Start Date End Date Taking? Authorizing Provider  albuterol (PROVENTIL HFA;VENTOLIN HFA) 108 (90 BASE) MCG/ACT inhaler Inhale 1-2 puffs into the lungs every 6 (six) hours as needed for wheezing or shortness of breath.   Yes Historical Provider, MD  EPINEPHrine (EPIPEN 2-PAK) 0.3 mg/0.3 mL IJ SOAJ injection Inject 0.3 mLs (0.3 mg total) into the muscle once as needed (for severe allergic reaction). CAll 911 immediately if you have to use this medicine  08/13/14  Yes Dahlia Client Muthersbaugh, PA-C  ibuprofen (ADVIL,MOTRIN) 200 MG tablet Take 600 mg by mouth every 6 (six) hours as needed for mild pain or moderate pain.   Yes Historical Provider, MD  cephALEXin (KEFLEX) 500 MG capsule Take 1 capsule (500 mg total) by mouth 3 (three) times daily. Patient not taking: Reported on 12/21/2014 12/04/14   Garlon Hatchet, PA-C  diphenhydrAMINE (BENADRYL) 25 MG tablet Take 1 tablet (25 mg total) by mouth every 6 (six) hours as needed for itching (Rash). Patient not taking: Reported on 12/21/2014 08/13/14   Dahlia Client Muthersbaugh, PA-C  famotidine (PEPCID) 20 MG tablet Take 1 tablet (20 mg total) by mouth 2 (two) times daily. Patient not taking: Reported on 12/04/2014 08/13/14   Dahlia Client Muthersbaugh, PA-C  fluconazole (DIFLUCAN) 150 MG tablet Take 1 tablet (150 mg total) by mouth once. Repeat in 72 hours if needed. Patient not taking: Reported on 12/21/2014 12/04/14   Garlon Hatchet, PA-C  hydrocortisone 2.5 % lotion Apply topically 2 (two) times daily. Patient not taking: Reported on 12/21/2014 08/13/14   Dahlia Client Muthersbaugh, PA-C  predniSONE (DELTASONE) 20 MG tablet Take 2 tablets (40 mg total) by mouth daily. Patient not taking: Reported on 12/04/2014 08/13/14   Dahlia Client Muthersbaugh, PA-C   Triage Vitals: BP 118/70 mmHg  Pulse 94  Temp(Src) 98.8 F (37.1 C) (Oral)  Resp 16  SpO2 100%  LMP  12/10/2014 (Approximate)   Physical Exam  Constitutional: She is oriented to person, place, and time. She appears well-developed and well-nourished. No distress.  HENT:  Head: Normocephalic and atraumatic.  TMs clear bilaterally Oropharynx red without swelling or exudate No adenopathy  Eyes: Conjunctivae and EOM are normal.  Neck: Neck supple. No tracheal deviation present.  Cardiovascular: Normal rate.   Pulmonary/Chest: Effort normal and breath sounds normal.  Musculoskeletal: Normal range of motion.  Neurological: She is alert and oriented to person, place, and time.  Skin: Skin  is warm and dry.  Psychiatric: She has a normal mood and affect. Her behavior is normal.  Nursing note and vitals reviewed.   ED Course  Procedures (including critical care time)  DIAGNOSTIC STUDIES: Oxygen Saturation is 100% on RA, normal by my interpretation.    COORDINATION OF CARE: 11:18 PM-Discussed treatment plan which includes rx antibiotics with pt at bedside and pt agreed to plan.   Labs Review Labs Reviewed - No data to display  Imaging Review No results found.   EKG Interpretation None      MDM   Final diagnoses:  None   1. Pharyngitis  Uncomplicated viral URI with sore throat requiring supportive management.  . I personally performed the services described in this documentation, which was scribed in my presence. The recorded information has been reviewed and is accurate.       Elpidio Anis, PA-C 12/22/14 1191  April Palumbo, MD 12/22/14 [phone]

## 2014-12-22 NOTE — Discharge Instructions (Signed)
Salt Water Gargle °This solution will help make your mouth and throat feel better. °HOME CARE INSTRUCTIONS  °· Mix 1 teaspoon of salt in 8 ounces of warm water. °· Gargle with this solution as much or often as you need or as directed. Swish and gargle gently if you have any sores or wounds in your mouth. °· Do not swallow this mixture. °Document Released: 03/29/2004 Document Revised: 09/17/2011 Document Reviewed: 08/20/2008 °ExitCare® Patient Information ©2015 [employer]. This information is not intended to replace advice given to you by your health care provider. Make sure you discuss any questions you have with your health care provider. °Pharyngitis °Pharyngitis is redness, pain, and swelling (inflammation) of your pharynx.  °CAUSES  °Pharyngitis is usually caused by infection. Most of the time, these infections are from viruses (viral) and are part of a cold. However, sometimes pharyngitis is caused by bacteria (bacterial). Pharyngitis can also be caused by allergies. Viral pharyngitis may be spread from person to person by coughing, sneezing, and personal items or utensils (cups, forks, spoons, toothbrushes). Bacterial pharyngitis may be spread from person to person by more intimate contact, such as kissing.  °SIGNS AND SYMPTOMS  °Symptoms of pharyngitis include:   °· Sore throat.   °· Tiredness (fatigue).   °· Low-grade fever.   °· Headache. °· Joint pain and muscle aches. °· Skin rashes. °· Swollen lymph nodes. °· Plaque-like film on throat or tonsils (often seen with bacterial pharyngitis). °DIAGNOSIS  °Your health care provider will ask you questions about your illness and your symptoms. Your medical history, along with a physical exam, is often all that is needed to diagnose pharyngitis. Sometimes, a rapid strep test is done. Other lab tests may also be done, depending on the suspected cause.  °TREATMENT  °Viral pharyngitis will usually get better in 3-4 days without the use of medicine. Bacterial  pharyngitis is treated with medicines that kill germs (antibiotics).  °HOME CARE INSTRUCTIONS  °· Drink enough water and fluids to keep your urine clear or pale yellow.   °· Only take over-the-counter or prescription medicines as directed by your health care provider:   °¨ If you are prescribed antibiotics, make sure you finish them even if you start to feel better.   °¨ Do not take aspirin.   °· Get lots of rest.   °· Gargle with 8 oz of salt water (½ tsp of salt per 1 qt of water) as often as every 1-2 hours to soothe your throat.   °· Throat lozenges (if you are not at risk for choking) or sprays may be used to soothe your throat. °SEEK MEDICAL CARE IF:  °· You have large, tender lumps in your neck. °· You have a rash. °· You cough up green, yellow-brown, or bloody spit. °SEEK IMMEDIATE MEDICAL CARE IF:  °· Your neck becomes stiff. °· You drool or are unable to swallow liquids. °· You vomit or are unable to keep medicines or liquids down. °· You have severe pain that does not go away with the use of recommended medicines. °· You have trouble breathing (not caused by a stuffy nose). °MAKE SURE YOU:  °· Understand these instructions. °· Will watch your condition. °· Will get help right away if you are not doing well or get worse. °Document Released: 06/25/2005 Document Revised: 04/15/2013 Document Reviewed: 03/02/2013 °ExitCare® Patient Information ©2015 [employer]. This information is not intended to replace advice given to you by your health care provider. Make sure you discuss any questions you have with your health care provider. ° °

## 2014-12-28 ENCOUNTER — Encounter (HOSPITAL_COMMUNITY): Payer: Self-pay | Admitting: Emergency Medicine

## 2014-12-28 ENCOUNTER — Emergency Department (HOSPITAL_COMMUNITY)
Admission: EM | Admit: 2014-12-28 | Discharge: 2014-12-28 | Disposition: A | Discharge status: Home / Self Care | Payer: Self-pay | Attending: Emergency Medicine | Admitting: Emergency Medicine

## 2014-12-28 DIAGNOSIS — N390 Urinary tract infection, site not specified: Secondary | ICD-10-CM

## 2014-12-28 DIAGNOSIS — J45909 Unspecified asthma, uncomplicated: Secondary | ICD-10-CM | POA: Insufficient documentation

## 2014-12-28 DIAGNOSIS — Z3202 Encounter for pregnancy test, result negative: Secondary | ICD-10-CM | POA: Insufficient documentation

## 2014-12-28 DIAGNOSIS — H9201 Otalgia, right ear: Secondary | ICD-10-CM | POA: Insufficient documentation

## 2014-12-28 DIAGNOSIS — Z79899 Other long term (current) drug therapy: Secondary | ICD-10-CM | POA: Insufficient documentation

## 2014-12-28 DIAGNOSIS — J029 Acute pharyngitis, unspecified: Secondary | ICD-10-CM

## 2014-12-28 LAB — URINALYSIS, ROUTINE W REFLEX MICROSCOPIC
BILIRUBIN URINE: NEGATIVE
GLUCOSE, UA: NEGATIVE mg/dL
KETONES UR: NEGATIVE mg/dL
Nitrite: NEGATIVE
Protein, ur: NEGATIVE mg/dL
Specific Gravity, Urine: 1.026 (ref 1.005–1.030)
UROBILINOGEN UA: 1 mg/dL (ref 0.0–1.0)
pH: 8.5 — ABNORMAL HIGH (ref 5.0–8.0)

## 2014-12-28 LAB — URINE MICROSCOPIC-ADD ON

## 2014-12-28 LAB — POC URINE PREG, ED: Preg Test, Ur: NEGATIVE

## 2014-12-28 LAB — RAPID STREP SCREEN (MED CTR MEBANE ONLY): STREPTOCOCCUS, GROUP A SCREEN (DIRECT): NEGATIVE

## 2014-12-28 MED ORDER — CEPHALEXIN 500 MG PO CAPS: 500.0000 mg | ORAL_CAPSULE | Freq: Two times a day (BID) | ORAL | Status: DC

## 2014-12-28 MED ORDER — SUCRALFATE 1 GM/10ML PO SUSP: 0.5000 g | Freq: Three times a day (TID) | ORAL | Status: DC | PRN

## 2014-12-28 MED ORDER — SUCRALFATE 1 GM/10ML PO SUSP
0.5000 g | ORAL | Status: AC
  Administered 2014-12-28: 0.5 g via ORAL
  Filled 2014-12-28: qty 10

## 2014-12-28 NOTE — Discharge Instructions (Signed)
Please follow up with your primary care physician in 1-2 days. If you do not have one please call the [facility] and wellness Center number listed above. Please take your antibiotic until completion. Please read all discharge instructions and return precautions.   Urinary Tract Infection Urinary tract infections (UTIs) can develop anywhere along your urinary tract. Your urinary tract is your body's drainage system for removing wastes and extra water. Your urinary tract includes two kidneys, two ureters, a bladder, and a urethra. Your kidneys are a pair of bean-shaped organs. Each kidney is about the size of your fist. They are located below your ribs, one on each side of your spine. CAUSES Infections are caused by microbes, which are microscopic organisms, including fungi, viruses, and bacteria. These organisms are so small that they can only be seen through a microscope. Bacteria are the microbes that most commonly cause UTIs. SYMPTOMS  Symptoms of UTIs may vary by age and gender of the patient and by the location of the infection. Symptoms in young women typically include a frequent and intense urge to urinate and a painful, burning feeling in the bladder or urethra during urination. Older women and men are more likely to be tired, shaky, and weak and have muscle aches and abdominal pain. A fever may mean the infection is in your kidneys. Other symptoms of a kidney infection include pain in your back or sides below the ribs, nausea, and vomiting. DIAGNOSIS To diagnose a UTI, your caregiver will ask you about your symptoms. Your caregiver also will ask to provide a urine sample. The urine sample will be tested for bacteria and white blood cells. White blood cells are made by your body to help fight infection. TREATMENT  Typically, UTIs can be treated with medication. Because most UTIs are caused by a bacterial infection, they usually can be treated with the use of antibiotics. The choice of antibiotic  and length of treatment depend on your symptoms and the type of bacteria causing your infection. HOME CARE INSTRUCTIONS  If you were prescribed antibiotics, take them exactly as your caregiver instructs you. Finish the medication even if you feel better after you have only taken some of the medication.  Drink enough water and fluids to keep your urine clear or pale yellow.  Avoid caffeine, tea, and carbonated beverages. They tend to irritate your bladder.  Empty your bladder often. Avoid holding urine for long periods of time.  Empty your bladder before and after sexual intercourse.  After a bowel movement, women should cleanse from front to back. Use each tissue only once. SEEK MEDICAL CARE IF:   You have back pain.  You develop a fever.  Your symptoms do not begin to resolve within 3 days. SEEK IMMEDIATE MEDICAL CARE IF:   You have severe back pain or lower abdominal pain.  You develop chills.  You have nausea or vomiting.  You have continued burning or discomfort with urination. MAKE SURE YOU:   Understand these instructions.  Will watch your condition.  Will get help right away if you are not doing well or get worse. Document Released: 04/04/2005 Document Revised: 12/25/2011 Document Reviewed: 08/03/2011 [facility] Patient Information 2015 [place], [place]. This information is not intended to replace advice given to you by your health care provider. Make sure you discuss any questions you have with your health care provider. Pharyngitis Pharyngitis is redness, pain, and swelling (inflammation) of your pharynx.  CAUSES  Pharyngitis is usually caused by infection. Most of the  time, these infections are from viruses (viral) and are part of a cold. However, sometimes pharyngitis is caused by bacteria (bacterial). Pharyngitis can also be caused by allergies. Viral pharyngitis may be spread from person to person by coughing, sneezing, and personal items or utensils (cups, forks,  spoons, toothbrushes). Bacterial pharyngitis may be spread from person to person by more intimate contact, such as kissing.  SIGNS AND SYMPTOMS  Symptoms of pharyngitis include:   Sore throat.   Tiredness (fatigue).   Low-grade fever.   Headache.  Joint pain and muscle aches.  Skin rashes.  Swollen lymph nodes.  Plaque-like film on throat or tonsils (often seen with bacterial pharyngitis). DIAGNOSIS  Your health care provider will ask you questions about your illness and your symptoms. Your medical history, along with a physical exam, is often all that is needed to diagnose pharyngitis. Sometimes, a rapid strep test is done. Other lab tests may also be done, depending on the suspected cause.  TREATMENT  Viral pharyngitis will usually get better in 3-4 days without the use of medicine. Bacterial pharyngitis is treated with medicines that kill germs (antibiotics).  HOME CARE INSTRUCTIONS   Drink enough water and fluids to keep your urine clear or pale yellow.   Only take over-the-counter or prescription medicines as directed by your health care provider:   If you are prescribed antibiotics, make sure you finish them even if you start to feel better.   Do not take aspirin.   Get lots of rest.   Gargle with 8 oz of salt water ( tsp of salt per 1 qt of water) as often as every 1-2 hours to soothe your throat.   Throat lozenges (if you are not at risk for choking) or sprays may be used to soothe your throat. SEEK MEDICAL CARE IF:   You have large, tender lumps in your neck.  You have a rash.  You cough up green, yellow-brown, or bloody spit. SEEK IMMEDIATE MEDICAL CARE IF:   Your neck becomes stiff.  You drool or are unable to swallow liquids.  You vomit or are unable to keep medicines or liquids down.  You have severe pain that does not go away with the use of recommended medicines.  You have trouble breathing (not caused by a stuffy nose). MAKE SURE YOU:    Understand these instructions.  Will watch your condition.  Will get help right away if you are not doing well or get worse. Document Released: 06/25/2005 Document Revised: 04/15/2013 Document Reviewed: 03/02/2013 [facility] Patient Information 2015 [place], [place]. This information is not intended to replace advice given to you by your health care provider. Make sure you discuss any questions you have with your health care provider.

## 2014-12-28 NOTE — ED Notes (Signed)
Pt here for evaluation of sore throat, was told it was pharyngitis and has been taking sinus medications, has not experienced any relief. Pt reports "patches" in her throat. Also believes she has a UTI, reports frequency and urgency to urinate, feels like she is not peeing everything out. Denies itching and vaginal discharge. VSS. A/Ox4.

## 2014-12-28 NOTE — ED Provider Notes (Signed)
CSN: [id number]     Arrival date & time 12/28/14  4765 History   First MD Initiated Contact with Patient 12/28/14 [phone]     Chief Complaint  Patient presents with  . Sore Throat  . Urinary Tract Infection     (Consider location/radiation/quality/duration/timing/severity/associated sxs/prior Treatment) HPI Comments: Patient is a 24 yo F presenting to the emergency department for 2 complaints. Patient is complaining of continued sore throat and right-sided ear pain subjective fevers and chills since being seen on June 14. She's been taking over-the-counter sinus medications with no relief. She also reports now seeing "white patches" in the back of her throat. Patient is also concerned she may have a UTI. She states she woke this morning with frequency and urgency to urinate with decreased urine output. Denies any dysuria or hematuria. Denies any vaginal discharge or discomfort. Denies any recent unprotected sexual intercourse. Denies any abdominal pain or back pain. LMP 12/10/14   Past Medical History  Diagnosis Date  . Asthma   . Strep throat    History reviewed. No pertinent past surgical history. Family History  Problem Relation Age of Onset  . Cancer Mother    History  Substance Use Topics  . Smoking status: Never Smoker   . Smokeless tobacco: Never Used  . Alcohol Use: Yes     Comment: occasional per pt   OB History    No data available     Review of Systems  Constitutional: Positive for fever (subjective).  HENT: Positive for congestion, ear pain and sore throat.   Genitourinary: Positive for urgency, frequency and decreased urine volume. Negative for dysuria, hematuria, flank pain, vaginal bleeding, vaginal discharge and vaginal pain.  All other systems reviewed and are negative.     Allergies  Tomato  Home Medications   Prior to Admission medications   Medication Sig Start Date End Date Taking? Authorizing Provider  albuterol (PROVENTIL HFA;VENTOLIN HFA) 108 (90  BASE) MCG/ACT inhaler Inhale 1-2 puffs into the lungs every 6 (six) hours as needed for wheezing or shortness of breath.   Yes Historical Provider, MD  EPINEPHrine (EPIPEN 2-PAK) 0.3 mg/0.3 mL IJ SOAJ injection Inject 0.3 mLs (0.3 mg total) into the muscle once as needed (for severe allergic reaction). CAll 911 immediately if you have to use this medicine 08/13/14  Yes Dahlia Client Muthersbaugh, PA-C  ibuprofen (ADVIL,MOTRIN) 200 MG tablet Take 600 mg by mouth every 6 (six) hours as needed for mild pain or moderate pain.   Yes Historical Provider, MD  cephALEXin (KEFLEX) 500 MG capsule Take 1 capsule (500 mg total) by mouth 2 (two) times daily. 12/28/14   Mima Cranmore, PA-C  sucralfate (CARAFATE) 1 GM/10ML suspension Take 5 mLs (0.5 g total) by mouth 3 (three) times daily as needed (throat pain). 12/28/14   Jesiah Yerby, PA-C   BP 117/80 mmHg  Pulse 87  Temp(Src) 98.6 F (37 C) (Oral)  Resp 18  SpO2 95%  LMP 12/10/2014 (Approximate) Physical Exam  Constitutional: She is oriented to person, place, and time. She appears well-developed and well-nourished. No distress.  HENT:  Head: Normocephalic and atraumatic.  Right Ear: Hearing, tympanic membrane, external ear and ear canal normal.  Left Ear: Hearing, tympanic membrane, external ear and ear canal normal.  Nose: Nose normal.  Mouth/Throat: Uvula is midline and mucous membranes are normal. No trismus in the jaw. No uvula swelling. Oropharyngeal exudate present.  Eyes: Conjunctivae are normal.  Neck: Normal range of motion. Neck supple.  No nuchal rigidity.  Cardiovascular: Normal rate, regular rhythm and normal heart sounds.   Pulmonary/Chest: Effort normal and breath sounds normal.  Abdominal: Soft. There is no tenderness.  Musculoskeletal: Normal range of motion.  Lymphadenopathy:    She has no cervical adenopathy.  Neurological: She is alert and oriented to person, place, and time.  Skin: Skin is warm and dry. She is not  diaphoretic.  Psychiatric: She has a normal mood and affect.  Nursing note and vitals reviewed.   ED Course  Procedures (including critical care time) Medications  sucralfate (CARAFATE) 1 GM/10ML suspension 0.5 g (0.5 g Oral Given 12/28/14 1039)    Labs Review Labs Reviewed  URINALYSIS, ROUTINE W REFLEX MICROSCOPIC (NOT AT [facility]) - Abnormal; Notable for the following:    APPearance CLOUDY (*)    pH 8.5 (*)    Hgb urine dipstick LARGE (*)    Leukocytes, UA MODERATE (*)    All other components within normal limits  URINE MICROSCOPIC-ADD ON - Abnormal; Notable for the following:    Squamous Epithelial / LPF MANY (*)    All other components within normal limits  RAPID STREP SCREEN (NOT AT [facility])  CULTURE, GROUP A STREP  POC URINE PREG, ED    Imaging Review No results found.   EKG Interpretation None      MDM   Final diagnoses:  UTI (lower urinary tract infection)  Viral pharyngitis    Filed Vitals:   12/28/14 1048  BP:   Pulse:   Temp: 98.6 F (37 C)  Resp:    Afebrile, NAD, non-toxic appearing, AAOx4.  I have reviewed nursing notes, vital signs, and all appropriate lab and imaging results if ordered as above.   1) UTI: Pt has been diagnosed with a UTI. Pt is afebrile, no CVA tenderness, normotensive, and denies N/V. Pt to be dc home with antibiotics and instructions to follow up with PCP if symptoms persist.  2) Viral pharyngitis: Negative strep; diagnosis of viral pharyngitis. No abx indicated. DC w symptomatic tx for pain  Pt does not appear dehydrated, but did discuss importance of water rehydration. Presentation non concerning for PTA or infxn spread to soft tissue. No trismus or uvula deviation. Specific return precautions discussed. Pt able to drink water in ED without difficulty with intact air way. Recommended PCP follow up.   Patient is stable at time of discharge      Francee Piccolo, PA-C 12/28/14 1102  Elwin Mocha, MD 12/28/14 1339

## 2014-12-31 LAB — CULTURE, GROUP A STREP: Strep A Culture: NEGATIVE

## 2015-01-21 ENCOUNTER — Encounter (HOSPITAL_COMMUNITY): Payer: Self-pay | Admitting: Emergency Medicine

## 2015-01-21 ENCOUNTER — Emergency Department (HOSPITAL_COMMUNITY)
Admission: EM | Admit: 2015-01-21 | Discharge: 2015-01-21 | Disposition: A | Discharge status: Home / Self Care | Payer: Self-pay | Attending: Emergency Medicine | Admitting: Emergency Medicine

## 2015-01-21 DIAGNOSIS — Z79899 Other long term (current) drug therapy: Secondary | ICD-10-CM | POA: Insufficient documentation

## 2015-01-21 DIAGNOSIS — J45909 Unspecified asthma, uncomplicated: Secondary | ICD-10-CM | POA: Insufficient documentation

## 2015-01-21 DIAGNOSIS — Z87440 Personal history of urinary (tract) infections: Secondary | ICD-10-CM | POA: Insufficient documentation

## 2015-01-21 DIAGNOSIS — Y9289 Other specified places as the place of occurrence of the external cause: Secondary | ICD-10-CM | POA: Insufficient documentation

## 2015-01-21 DIAGNOSIS — R2231 Localized swelling, mass and lump, right upper limb: Secondary | ICD-10-CM | POA: Insufficient documentation

## 2015-01-21 DIAGNOSIS — R350 Frequency of micturition: Secondary | ICD-10-CM | POA: Insufficient documentation

## 2015-01-21 DIAGNOSIS — R220 Localized swelling, mass and lump, head: Secondary | ICD-10-CM | POA: Insufficient documentation

## 2015-01-21 DIAGNOSIS — Y9389 Activity, other specified: Secondary | ICD-10-CM | POA: Insufficient documentation

## 2015-01-21 DIAGNOSIS — X58XXXA Exposure to other specified factors, initial encounter: Secondary | ICD-10-CM | POA: Insufficient documentation

## 2015-01-21 DIAGNOSIS — Y998 Other external cause status: Secondary | ICD-10-CM | POA: Insufficient documentation

## 2015-01-21 DIAGNOSIS — Z3202 Encounter for pregnancy test, result negative: Secondary | ICD-10-CM | POA: Insufficient documentation

## 2015-01-21 LAB — URINALYSIS, ROUTINE W REFLEX MICROSCOPIC
Bilirubin Urine: NEGATIVE
GLUCOSE, UA: NEGATIVE mg/dL
Hgb urine dipstick: NEGATIVE
KETONES UR: NEGATIVE mg/dL
NITRITE: NEGATIVE
PH: 6 (ref 5.0–8.0)
Protein, ur: NEGATIVE mg/dL
Specific Gravity, Urine: 1.023 (ref 1.005–1.030)
Urobilinogen, UA: 1 mg/dL (ref 0.0–1.0)

## 2015-01-21 LAB — POC URINE PREG, ED: PREG TEST UR: NEGATIVE

## 2015-01-21 LAB — CBG MONITORING, ED: GLUCOSE-CAPILLARY: 80 mg/dL (ref 65–99)

## 2015-01-21 LAB — URINE MICROSCOPIC-ADD ON

## 2015-01-21 MED ORDER — SODIUM CHLORIDE 0.9 % IV SOLN: INTRAVENOUS | Status: DC

## 2015-01-21 NOTE — ED Provider Notes (Signed)
CSN: [id number]     Arrival date & time 01/21/15  [id number] History   First MD Initiated Contact with Patient 01/21/15 0920     Chief Complaint  Patient presents with  . Allergic Reaction  . Urinary Frequency     (Consider location/radiation/quality/duration/timing/severity/associated sxs/prior Treatment) Patient is a 24 y.o. female presenting with allergic reaction and frequency. The history is provided by the patient.  Allergic Reaction Presenting symptoms: rash   Urinary Frequency Pertinent negatives include no chest pain, no abdominal pain, no headaches and no shortness of breath.  Patient with hx eczema, asthma, c/o urinary frequency in the past day. No dysuria. No odor. No abd or flank pain. No vaginal discharge or bleeding. States hx uti. Normal appetite. No nv. No fever or chills. Today also noted few small bumps to dorsum right hand and chin. Sl itchy. Not painful. No erythema. No other rash or lesions. Denies hx same. No chemical exposure. No change in any home or personal products. No recent new meds. No known exposure.      Past Medical History  Diagnosis Date  . Asthma   . Strep throat    History reviewed. No pertinent past surgical history. Family History  Problem Relation Age of Onset  . Cancer Mother    History  Substance Use Topics  . Smoking status: Never Smoker   . Smokeless tobacco: Never Used  . Alcohol Use: Yes     Comment: occasional per pt   OB History    No data available     Review of Systems  Constitutional: Negative for fever.  HENT: Negative for sore throat.   Eyes: Negative for redness.  Respiratory: Negative for shortness of breath.   Cardiovascular: Negative for chest pain.  Gastrointestinal: Negative for vomiting and abdominal pain.  Genitourinary: Positive for frequency. Negative for dysuria and flank pain.  Musculoskeletal: Negative for back pain and neck pain.  Skin: Positive for rash.  Neurological: Negative for headaches.   Hematological: Does not bruise/bleed easily.  Psychiatric/Behavioral: Negative for confusion.      Allergies  Tomato  Home Medications   Prior to Admission medications   Medication Sig Start Date End Date Taking? Authorizing Provider  albuterol (PROVENTIL HFA;VENTOLIN HFA) 108 (90 BASE) MCG/ACT inhaler Inhale 1-2 puffs into the lungs every 6 (six) hours as needed for wheezing or shortness of breath.    Historical Provider, MD  cephALEXin (KEFLEX) 500 MG capsule Take 1 capsule (500 mg total) by mouth 2 (two) times daily. 12/28/14   Jennifer Piepenbrink, PA-C  EPINEPHrine (EPIPEN 2-PAK) 0.3 mg/0.3 mL IJ SOAJ injection Inject 0.3 mLs (0.3 mg total) into the muscle once as needed (for severe allergic reaction). CAll 911 immediately if you have to use this medicine 08/13/14   [facility] Muthersbaugh, PA-C  ibuprofen (ADVIL,MOTRIN) 200 MG tablet Take 600 mg by mouth every 6 (six) hours as needed for mild pain or moderate pain.    Historical Provider, MD  sucralfate (CARAFATE) 1 GM/10ML suspension Take 5 mLs (0.5 g total) by mouth 3 (three) times daily as needed (throat pain). 12/28/14   Jennifer Piepenbrink, PA-C   BP 116/69 mmHg  Pulse 94  Temp(Src) 98 F (36.7 C) (Oral)  Resp 17  SpO2 99%  LMP 01/06/2015 Physical Exam  Constitutional: She appears well-developed and well-nourished. No distress.  HENT:  Mouth/Throat: Oropharynx is clear and moist.  Eyes: Conjunctivae are normal. No scleral icterus.  Neck: Neck supple. No tracheal deviation present.  Cardiovascular: Normal rate, regular  rhythm, normal heart sounds and intact distal pulses.  Exam reveals no gallop and no friction rub.   No murmur heard. Pulmonary/Chest: Effort normal and breath sounds normal. No respiratory distress.  Abdominal: Soft. Normal appearance. She exhibits no distension. There is no tenderness.  Genitourinary:  No cva tenderness  Musculoskeletal: She exhibits no edema.  Neurological: She is alert.  Skin: Skin is  warm and dry. She is not diaphoretic.  A few sparse, scattered, 1 mm diameter, sl pustular lesions to chin and dorsum right hand. No other rash/lesions noted. No palms, soles, or oral lesions noted.   Psychiatric: She has a normal mood and affect.  Nursing note and vitals reviewed.   ED Course  Procedures (including critical care time) Labs Review  Results for orders placed or performed during the hospital encounter of 01/21/15  Urinalysis, Routine w reflex microscopic (not at [facility])  Result Value Ref Range   Color, Urine YELLOW YELLOW   APPearance CLOUDY (A) CLEAR   Specific Gravity, Urine 1.023 1.005 - 1.030   pH 6.0 5.0 - 8.0   Glucose, UA NEGATIVE NEGATIVE mg/dL   Hgb urine dipstick NEGATIVE NEGATIVE   Bilirubin Urine NEGATIVE NEGATIVE   Ketones, ur NEGATIVE NEGATIVE mg/dL   Protein, ur NEGATIVE NEGATIVE mg/dL   Urobilinogen, UA 1.0 0.0 - 1.0 mg/dL   Nitrite NEGATIVE NEGATIVE   Leukocytes, UA SMALL (A) NEGATIVE  Urine microscopic-add on  Result Value Ref Range   Squamous Epithelial / LPF MANY (A) RARE   WBC, UA 0-2 <3 WBC/hpf  POC Urine Pregnancy, ED (do NOT order at [facility])  Result Value Ref Range   Preg Test, Ur NEGATIVE NEGATIVE  POC CBG, ED  Result Value Ref Range   Glucose-Capillary 80 65 - 99 mg/dL      MDM   Labs.  Reviewed nursing notes and prior charts for additional history.   ua neg.   Pt appears stable for d/c.  Discussed labs, pcp f/u w pt.       Cathren Laine, MD 01/21/15 1057

## 2015-01-21 NOTE — Discharge Instructions (Signed)
It was our pleasure to provide your ER care today - we hope that you feel better.  For rash, keep area very clean, avoid any perfumed soaps or detergents.  You may apply thin coat of hydrocortisone cream (available over the counter).   Follow up with primary care doctor in the next few days for recheck if symptoms fail to improve/resolve.  Return to ER if worse, new symptoms, fevers, abdominal pain, other concern.

## 2015-01-21 NOTE — ED Notes (Addendum)
PT woke up with breakout on bottom lip/chin and right hand. Chest tightness- hx of asthma. Rescue inhaler helpful. States rash itches. No new detergents, foods, lotions. Denies angioedema or feeling like throat or tongue is swollen. Pt reports urinary frequency; denies odor or discharge. Recently been tx for UTIs (2 in past month).

## 2015-01-21 NOTE — ED Notes (Signed)
CBG: 80 

## 2015-01-26 ENCOUNTER — Emergency Department (HOSPITAL_COMMUNITY)
Admission: EM | Admit: 2015-01-26 | Discharge: 2015-01-26 | Disposition: A | Discharge status: Home / Self Care | Payer: Self-pay | Attending: Emergency Medicine | Admitting: Emergency Medicine

## 2015-01-26 ENCOUNTER — Encounter (HOSPITAL_COMMUNITY): Payer: Self-pay | Admitting: Family Medicine

## 2015-01-26 DIAGNOSIS — N390 Urinary tract infection, site not specified: Secondary | ICD-10-CM | POA: Insufficient documentation

## 2015-01-26 DIAGNOSIS — Z3202 Encounter for pregnancy test, result negative: Secondary | ICD-10-CM | POA: Insufficient documentation

## 2015-01-26 DIAGNOSIS — J45909 Unspecified asthma, uncomplicated: Secondary | ICD-10-CM | POA: Insufficient documentation

## 2015-01-26 DIAGNOSIS — Z79899 Other long term (current) drug therapy: Secondary | ICD-10-CM | POA: Insufficient documentation

## 2015-01-26 LAB — URINE MICROSCOPIC-ADD ON

## 2015-01-26 LAB — URINALYSIS, ROUTINE W REFLEX MICROSCOPIC
Bilirubin Urine: NEGATIVE
Glucose, UA: NEGATIVE mg/dL
Ketones, ur: NEGATIVE mg/dL
NITRITE: NEGATIVE
Protein, ur: 30 mg/dL — AB
Specific Gravity, Urine: 1.037 — ABNORMAL HIGH (ref 1.005–1.030)
Urobilinogen, UA: 1 mg/dL (ref 0.0–1.0)
pH: 6 (ref 5.0–8.0)

## 2015-01-26 LAB — PREGNANCY, URINE: PREG TEST UR: NEGATIVE

## 2015-01-26 MED ORDER — PHENAZOPYRIDINE HCL 200 MG PO TABS: 200.0000 mg | ORAL_TABLET | Freq: Three times a day (TID) | ORAL | Status: DC

## 2015-01-26 MED ORDER — CEPHALEXIN 500 MG PO CAPS: 500.0000 mg | ORAL_CAPSULE | Freq: Two times a day (BID) | ORAL | Status: DC

## 2015-01-26 NOTE — Discharge Instructions (Signed)

## 2015-01-26 NOTE — ED Provider Notes (Signed)
CSN: [id number]     Arrival date & time 01/26/15  1757 History  This chart was scribed for Teressa Lower, NP, working with Pricilla Loveless, MD by Elon Spanner, ED Scribe. This patient was seen in room TR08C/TR08C and the patient's care was started at 6:17 PM.   Chief Complaint  Patient presents with  . Urinary Frequency   The history is provided by the patient. No language interpreter was used.   HPI Comments: Wendy Morton is a 24 y.o. female who presents to the Emergency Department complaining of increased urinary frequency onset 1 week ago.  The patient was seen 1 month ago for similar symptoms and diagnosed with a UTI.  She was prescribed Keflex and told to f/u with PCP if symptoms persisted.  The complaint resolved with the antibiotics but recurred one week ago.  She denies fever, vomiting.  Patient is currently menstruating Past Medical History  Diagnosis Date  . Asthma   . Strep throat    History reviewed. No pertinent past surgical history. Family History  Problem Relation Age of Onset  . Cancer Mother    History  Substance Use Topics  . Smoking status: Never Smoker   . Smokeless tobacco: Never Used  . Alcohol Use: Yes     Comment: occasional per pt   OB History    No data available     Review of Systems  Genitourinary: Positive for frequency.  All other systems reviewed and are negative.     Allergies  Tomato  Home Medications   Prior to Admission medications   Medication Sig Start Date End Date Taking? Authorizing Provider  albuterol (PROVENTIL HFA;VENTOLIN HFA) 108 (90 BASE) MCG/ACT inhaler Inhale 1-2 puffs into the lungs every 6 (six) hours as needed for wheezing or shortness of breath.    Historical Provider, MD  cephALEXin (KEFLEX) 500 MG capsule Take 1 capsule (500 mg total) by mouth 2 (two) times daily. Patient not taking: Reported on 01/21/2015 12/28/14   Francee Piccolo, PA-C  EPINEPHrine (EPIPEN 2-PAK) 0.3 mg/0.3 mL IJ SOAJ injection Inject 0.3  mLs (0.3 mg total) into the muscle once as needed (for severe allergic reaction). CAll 911 immediately if you have to use this medicine 08/13/14   [facility] Muthersbaugh, PA-C  ibuprofen (ADVIL,MOTRIN) 200 MG tablet Take 600 mg by mouth every 6 (six) hours as needed for mild pain or moderate pain.    Historical Provider, MD  sucralfate (CARAFATE) 1 GM/10ML suspension Take 5 mLs (0.5 g total) by mouth 3 (three) times daily as needed (throat pain). 12/28/14   Jennifer Piepenbrink, PA-C   BP 111/84 mmHg  Pulse 93  Temp(Src) 97.8 F (36.6 C) (Oral)  Resp 20  SpO2 100%  LMP 01/06/2015 Physical Exam  Constitutional: She is oriented to person, place, and time. She appears well-developed and well-nourished. No distress.  HENT:  Head: Normocephalic and atraumatic.  Eyes: Conjunctivae and EOM are normal.  Neck: Neck supple. No tracheal deviation present.  Cardiovascular: Normal rate.   Pulmonary/Chest: Effort normal. No respiratory distress.  Abdominal: Soft. Bowel sounds are normal. There is no tenderness. There is no CVA tenderness.  Musculoskeletal: Normal range of motion.  Neurological: She is alert and oriented to person, place, and time.  Skin: Skin is warm and dry.  Psychiatric: She has a normal mood and affect. Her behavior is normal.  Nursing note and vitals reviewed.   ED Course  Procedures (including critical care time)  DIAGNOSTIC STUDIES: Oxygen Saturation is 100% on  RA, normal by my interpretation.    COORDINATION OF CARE:  6:22 PM Discussed treatment plan with patient at bedside.  Patient acknowledges and agrees with plan.    Labs Review Labs Reviewed  URINALYSIS, ROUTINE W REFLEX MICROSCOPIC (NOT [facility]) - Abnormal; Notable for the following:    APPearance HAZY (*)    Specific Gravity, Urine 1.037 (*)    Hgb urine dipstick LARGE (*)    Protein, ur 30 (*)    Leukocytes, UA SMALL (*)    All other components within normal limits  URINE MICROSCOPIC-ADD ON - Abnormal;  Notable for the following:    Squamous Epithelial / LPF MANY (*)    Bacteria, UA FEW (*)    All other components within normal limits  PREGNANCY, URINE    Imaging Review No results found.   EKG Interpretation None      MDM   Final diagnoses:  UTI (lower urinary tract infection)    Will treat for uti with keflex and pyridium. Pt is symptomatic  I personally performed the services described in this documentation, which was scribed in my presence. The recorded information has been reviewed and is accurate.    Teressa LowerVrinda Kiarra Kidd, NP 01/26/15 1858  Pricilla LovelessScott Goldston, MD 01/26/15 [phone]

## 2015-01-26 NOTE — ED Notes (Signed)
Pt here for continues urinary frequency and UTI symptoms. Seen here last week for same. No taking abx.

## 2015-08-08 ENCOUNTER — Ambulatory Visit (INDEPENDENT_AMBULATORY_CARE_PROVIDER_SITE_OTHER): Payer: BC Managed Care – PPO | Admitting: Urgent Care

## 2015-08-08 VITALS — BP 102/78 | HR 80 | Temp 98.2°F | Resp 18 | Ht 66.0 in | Wt 263.0 lb

## 2015-08-08 DIAGNOSIS — J4530 Mild persistent asthma, uncomplicated: Secondary | ICD-10-CM

## 2015-08-08 DIAGNOSIS — J029 Acute pharyngitis, unspecified: Secondary | ICD-10-CM

## 2015-08-08 DIAGNOSIS — J069 Acute upper respiratory infection, unspecified: Secondary | ICD-10-CM

## 2015-08-08 DIAGNOSIS — J302 Other seasonal allergic rhinitis: Secondary | ICD-10-CM | POA: Diagnosis not present

## 2015-08-08 LAB — POCT RAPID STREP A (OFFICE): Rapid Strep A Screen: NEGATIVE

## 2015-08-08 MED ORDER — HYDROCODONE-HOMATROPINE 5-1.5 MG/5ML PO SYRP: 5.0000 mL | ORAL_SOLUTION | Freq: Every evening | ORAL | Status: DC | PRN

## 2015-08-08 NOTE — Progress Notes (Signed)
    MRN: [id number] DOB: 1990/08/15  Subjective:   Wendy Morton is a 25 y.o. female presenting for chief complaint of Sore Throat  Reports 2 day history of sore throat, neck pain, mild and intermittent right ear pain, subjective fever, sinus headache, nasal congestion, post-nasal drainage. Has tried Mucinex with minimal relief. Patient is a Runner, broadcasting/film/video, works with children. She would like to make sure she does not have strep given that she has had 2 contacts with this. Denies . Admits history of asthma, using albuterol intermittently since last week but not every day. Admits history of seasonal allergies, Drinks occasionally. Denies smoking cigarettes.  Wendy Morton has a current medication list which includes the following prescription(s): albuterol, epinephrine, and ibuprofen. Also is allergic to tomato.  Wendy Morton  has a past medical history of Asthma and Strep throat. Also  has no past surgical history on file.  Objective:   Vitals: BP 102/78 mmHg  Pulse 80  Temp(Src) 98.2 F (36.8 C) (Oral)  Resp 18  Ht  (1.676 m)  Wt 263 lb (119.296 kg)  BMI 42.47 kg/m2  SpO2 96%  LMP 07/25/2015  Physical Exam  Constitutional: She is oriented to person, place, and time. She appears well-developed and well-nourished.  HENT:  TM's flat bilaterally, no effusions or erythema. Nasal turbinates pink and moist. No sinus tenderness. Postnasal drip present, without oropharyngeal exudates, erythema or abscesses.  Eyes: Right eye exhibits no discharge. Left eye exhibits no discharge. No scleral icterus.  Neck: Normal range of motion. Neck supple.  Cardiovascular: Normal rate, regular rhythm and intact distal pulses.  Exam reveals no gallop and no friction rub.   No murmur heard. Pulmonary/Chest: No respiratory distress. She has no wheezes. She has no rales.  Musculoskeletal: She exhibits no edema.  Lymphadenopathy:    She has cervical adenopathy (anterior, R>L).  Neurological: She is alert and oriented to person,  place, and time.  Skin: Skin is warm and dry.   Results for orders placed or performed in visit on 08/08/15 (from the past 24 hour(s))  POCT rapid strep A     Status: None   Collection Time: 08/08/15  6:06 PM  Result Value Ref Range   Rapid Strep A Screen Negative Negative   Assessment and Plan :   1. URI (upper respiratory infection) 2. Sore throat - Strep culture pending. Likely undergoing viral syndrome. Recommended supportive care. Hycodan for sore throat. RTC in 1 week if no improvement.  3. Extrinsic asthma, mild persistent, uncomplicated - Continue albuterol use as needed.  4. Seasonal allergies - Restart Zyrtec for prevention and/or worsening symptoms.  Wallis Bamberg, PA-C [facility] [phone] 08/08/2015 5:46 PM

## 2015-08-08 NOTE — Patient Instructions (Signed)
Upper Respiratory Infection, Adult Most upper respiratory infections (URIs) are a viral infection of the air passages leading to the lungs. A URI affects the nose, throat, and upper air passages. The most common type of URI is nasopharyngitis and is typically referred to as "the common cold." URIs run their course and usually go away on their own. Most of the time, a URI does not require medical attention, but sometimes a bacterial infection in the upper airways can follow a viral infection. This is called a secondary infection. Sinus and middle ear infections are common types of secondary upper respiratory infections. Bacterial pneumonia can also complicate a URI. A URI can worsen asthma and chronic obstructive pulmonary disease (COPD). Sometimes, these complications can require emergency medical care and may be life threatening.  CAUSES Almost all URIs are caused by viruses. A virus is a type of germ and can spread from one person to another.  RISKS FACTORS You may be at risk for a URI if:   You smoke.   You have chronic heart or lung disease.  You have a weakened defense (immune) system.   You are very young or very old.   You have nasal allergies or asthma.  You work in crowded or poorly ventilated areas.  You work in health care facilities or schools. SIGNS AND SYMPTOMS  Symptoms typically develop 2-3 days after you come in contact with a cold virus. Most viral URIs last 7-10 days. However, viral URIs from the influenza virus (flu virus) can last 14-18 days and are typically more severe. Symptoms may include:   Runny or stuffy (congested) nose.   Sneezing.   Cough.   Sore throat.   Headache.   Fatigue.   Fever.   Loss of appetite.   Pain in your forehead, behind your eyes, and over your cheekbones (sinus pain).  Muscle aches.  DIAGNOSIS  Your health care provider may diagnose a URI by:  Physical exam.  Tests to check that your symptoms are not due to  another condition such as:  Strep throat.  Sinusitis.  Pneumonia.  Asthma. TREATMENT  A URI goes away on its own with time. It cannot be cured with medicines, but medicines may be prescribed or recommended to relieve symptoms. Medicines may help:  Reduce your fever.  Reduce your cough.  Relieve nasal congestion. HOME CARE INSTRUCTIONS   Take medicines only as directed by your health care provider.   Gargle warm saltwater or take cough drops to comfort your throat as directed by your health care provider.  Use a warm mist humidifier or inhale steam from a shower to increase air moisture. This may make it easier to breathe.  Drink enough fluid to keep your urine clear or pale yellow.   Eat soups and other clear broths and maintain good nutrition.   Rest as needed.   Return to work when your temperature has returned to normal or as your health care provider advises. You may need to stay home longer to avoid infecting others. You can also use a face mask and careful hand washing to prevent spread of the virus.  Increase the usage of your inhaler if you have asthma.   Do not use any tobacco products, including cigarettes, chewing tobacco, or electronic cigarettes. If you need help quitting, ask your health care provider. PREVENTION  The best way to protect yourself from getting a cold is to practice good hygiene.   Avoid oral or hand contact with people with cold   symptoms.   Wash your hands often if contact occurs.  There is no clear evidence that vitamin C, vitamin E, echinacea, or exercise reduces the chance of developing a cold. However, it is always recommended to get plenty of rest, exercise, and practice good nutrition.  SEEK MEDICAL CARE IF:   You are getting worse rather than better.   Your symptoms are not controlled by medicine.   You have chills.  You have worsening shortness of breath.  You have brown or red mucus.  You have yellow or brown nasal  discharge.  You have pain in your face, especially when you bend forward.  You have a fever.  You have swollen neck glands.  You have pain while swallowing.  You have white areas in the back of your throat. SEEK IMMEDIATE MEDICAL CARE IF:   You have severe or persistent:  Headache.  Ear pain.  Sinus pain.  Chest pain.  You have chronic lung disease and any of the following:  Wheezing.  Prolonged cough.  Coughing up blood.  A change in your usual mucus.  You have a stiff neck.  You have changes in your:  Vision.  Hearing.  Thinking.  Mood. MAKE SURE YOU:   Understand these instructions.  Will watch your condition.  Will get help right away if you are not doing well or get worse.   This information is not intended to replace advice given to you by your health care provider. Make sure you discuss any questions you have with your health care provider.   Document Released: 12/19/2000 Document Revised: 11/09/2014 Document Reviewed: 09/30/2013 Elsevier Interactive Patient Education 2016 Elsevier Inc.  

## 2015-08-08 NOTE — Addendum Note (Signed)
Addended by: Isaac Bliss on: 08/08/2015 07:06 PM   Modules accepted: Orders

## 2015-08-12 LAB — CULTURE, GROUP A STREP

## 2015-08-15 ENCOUNTER — Telehealth: Payer: Self-pay | Admitting: Urgent Care

## 2015-08-15 NOTE — Telephone Encounter (Signed)
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 She will come in to the clinic tomorrow for recheck. I offered her a course of amoxicillin given that she is still symptomatic but patient states that she cannot take amoxicillin and prefers to have an injection of penicillin. I advised that she should come in to clinic tomorrow for recheck and that they would provide her with treatment as appropriate here. Patient verbalized understanding.

## 2015-09-10 ENCOUNTER — Ambulatory Visit (INDEPENDENT_AMBULATORY_CARE_PROVIDER_SITE_OTHER): Payer: BC Managed Care – PPO | Admitting: Physician Assistant

## 2015-09-10 VITALS — BP 110/70 | HR 98 | Temp 98.5°F | Resp 18 | Ht 66.0 in

## 2015-09-10 DIAGNOSIS — N898 Other specified noninflammatory disorders of vagina: Secondary | ICD-10-CM

## 2015-09-10 DIAGNOSIS — B379 Candidiasis, unspecified: Secondary | ICD-10-CM

## 2015-09-10 LAB — POCT WET + KOH PREP
Trich by wet prep: ABSENT
YEAST BY WET PREP: ABSENT

## 2015-09-10 MED ORDER — GERHARDT'S BUTT CREAM: 1.0000 "application " | TOPICAL_CREAM | Freq: Two times a day (BID) | CUTANEOUS | Status: DC

## 2015-09-10 MED ORDER — FLUCONAZOLE 150 MG PO TABS: 150.0000 mg | ORAL_TABLET | Freq: Once | ORAL | Status: DC

## 2015-09-10 NOTE — Patient Instructions (Addendum)
Please take medication as prescribed.  I would like you to return if your symptoms do not improve.   Monilial Vaginitis Vaginitis in a soreness, swelling and redness (inflammation) of the vagina and vulva. Monilial vaginitis is not a sexually transmitted infection. CAUSES  Yeast vaginitis is caused by yeast (candida) that is normally found in your vagina. With a yeast infection, the candida has overgrown in number to a point that upsets the chemical balance. SYMPTOMS   White, thick vaginal discharge.  Swelling, itching, redness and irritation of the vagina and possibly the lips of the vagina (vulva).  Burning or painful urination.  Painful intercourse. DIAGNOSIS  Things that may contribute to monilial vaginitis are:  Postmenopausal and virginal states.  Pregnancy.  Infections.  Being tired, sick or stressed, especially if you had monilial vaginitis in the past.  Diabetes. Good control will help lower the chance.  Birth control pills.  Tight fitting garments.  Using bubble bath, feminine sprays, douches or deodorant tampons.  Taking certain medications that kill germs (antibiotics).  Sporadic recurrence can occur if you become ill. TREATMENT  Your caregiver will give you medication.  There are several kinds of anti monilial vaginal creams and suppositories specific for monilial vaginitis. For recurrent yeast infections, use a suppository or cream in the vagina 2 times a week, or as directed.  Anti-monilial or steroid cream for the itching or irritation of the vulva may also be used. Get your caregiver's permission.  Painting the vagina with methylene blue solution may help if the monilial cream does not work.  Eating yogurt may help prevent monilial vaginitis. HOME CARE INSTRUCTIONS   Finish all medication as prescribed.  Do not have sex until treatment is completed or after your caregiver tells you it is okay.  Take warm sitz baths.  Do not douche.  Do not use  tampons, especially scented ones.  Wear cotton underwear.  Avoid tight pants and panty hose.  Tell your sexual partner that you have a yeast infection. They should go to their caregiver if they have symptoms such as mild rash or itching.  Your sexual partner should be treated as well if your infection is difficult to eliminate.  Practice safer sex. Use condoms.  Some vaginal medications cause latex condoms to fail. Vaginal medications that harm condoms are:  Cleocin cream.  Butoconazole (Femstat).  Terconazole (Terazol) vaginal suppository.  Miconazole (Monistat) (may be purchased over the counter). SEEK MEDICAL CARE IF:   You have a temperature by mouth above 102 F (38.9 C).  The infection is getting worse after 2 days of treatment.  The infection is not getting better after 3 days of treatment.  You develop blisters in or around your vagina.  You develop vaginal bleeding, and it is not your menstrual period.  You have pain when you urinate.  You develop intestinal problems.  You have pain with sexual intercourse.   This information is not intended to replace advice given to you by your health care provider. Make sure you discuss any questions you have with your health care provider.   Document Released: 04/04/2005 Document Revised: 09/17/2011 Document Reviewed: 12/27/2014 Elsevier Interactive Patient Education [employer]2016 Elsevier Inc.

## 2015-09-10 NOTE — Progress Notes (Signed)
[facility] [address] [place] [address] [phone]  Date:  09/10/2015   Name:  Wendy Morton   DOB:  Dec 13, 1990   MRN:  [id number]  PCP:  Default, Provider, MD    History of Present Illness:  Wendy Morton is a 25 y.o. female patient who presents to [facility] for vaginal irritation and discharge. Patient has 3 days of discharge that is cottage cheese like.  She has no odor, but pruritic along vaginal area.  She had attempted to use a monistat suppository, 2 days ago, which did not help. She has no dysuria, hematuria, or frequency.  No fever or abdominal pain.    There are no active problems to display for this patient.   Past Medical History  Diagnosis Date  . Asthma   . Strep throat     No past surgical history on file.  Social History  Substance Use Topics  . Smoking status: Never Smoker   . Smokeless tobacco: Never Used  . Alcohol Use: 0.0 oz/week    0 Standard drinks or equivalent per week     Comment: occasional per pt    Family History  Problem Relation Age of Onset  . Cancer Mother     Allergies  Allergen Reactions  . Tomato Shortness Of Breath    Medication list has been reviewed and updated.  Current Outpatient Prescriptions on File Prior to Visit  Medication Sig Dispense Refill  . albuterol (PROVENTIL HFA;VENTOLIN HFA) 108 (90 BASE) MCG/ACT inhaler Inhale 1-2 puffs into the lungs every 6 (six) hours as needed for wheezing or shortness of breath.    . EPINEPHrine (EPIPEN 2-PAK) 0.3 mg/0.3 mL IJ SOAJ injection Inject 0.3 mLs (0.3 mg total) into the muscle once as needed (for severe allergic reaction). CAll 911 immediately if you have to use this medicine (Patient not taking: Reported on 09/10/2015) 2 Device 1  . HYDROcodone-homatropine (HYCODAN) 5-1.5 MG/5ML syrup Take 5 mLs by mouth at bedtime as needed. (Patient not taking: Reported on 09/10/2015) 120 mL 0  . ibuprofen (ADVIL,MOTRIN) 200 MG tablet Take 600 mg by mouth every 6 (six) hours  as needed for mild pain or moderate pain. Reported on 09/10/2015     No current facility-administered medications on file prior to visit.    ROS ROS otherwise unremarkable unless listed above.   Physical Examination: BP 110/70 mmHg  Pulse 98  Temp(Src) 98.5 F (36.9 C) (Oral)  Resp 18  Ht  (1.676 m)  Wt   SpO2 93%  LMP 08/26/2015 (Exact Date) Ideal Body Weight: Weight in (lb) to have BMI = 25: 154.6  Physical Exam  Constitutional: She is oriented to person, place, and time. She appears well-developed and well-nourished. No distress.  HENT:  Head: Normocephalic and atraumatic.  Right Ear: External ear normal.  Left Ear: External ear normal.  Eyes: Conjunctivae and EOM are normal. Pupils are equal, round, and reactive to light.  Cardiovascular: Normal rate.   Pulmonary/Chest: Effort normal. No respiratory distress.  Genitourinary: Pelvic exam was performed with patient supine.  Hx of eczema along labia majora which is present.  She has heavy white curd-like discharge, as well as abundant dry white crumbling matter consistent with monistat use.  Cervix is not friable.    Neurological: She is alert and oriented to person, place, and time.  Skin: She is not diaphoretic.  Psychiatric: She has a normal mood and affect. Her behavior is normal.  Results for orders placed or performed in visit on 09/10/15  POCT Wet + KOH Prep  Result Value Ref Range   Yeast by KOH Present Present, Absent   Yeast by wet prep Absent Present, Absent   WBC by wet prep Too numerous to count  None, Few, Too numerous to count   Clue Cells Wet Prep HPF POC None None, Too numerous to count   Trich by wet prep Absent Present, Absent   Bacteria Wet Prep HPF POC Few None, Few, Too numerous to count   Epithelial Cells By [employer]et Pref ([facility]) Many (A) None, Few, Too numerous to count   RBC,UR,HPF,POC Few (A) None RBC/hpf    Assessment and Plan: Wendy Morton is a 25 y.o. female who is here today for abnormal  vaginal discharge.  Will treat for vaginitis.  Advised her to try one tablet, and topical nystatin cream at this time.  She will allow a week until 2nd tablet if her sxs do not resolve.  She needs to allow.   She is here today with partner and declines gc/chl.  Advised to wear pads and change regularly as this monistat will irritate the skin.  Patient voiced understanding.   Yeast infection - Plan: Hydrocortisone (GERHARDT'S BUTT CREAM) CREA  Vaginal discharge - Plan: POCT Wet + KOH Prep, fluconazole (DIFLUCAN) 150 MG tablet, Hydrocortisone (GERHARDT'S BUTT CREAM) CREA  Wendy PlattStephanie Aliou Mealey, PA-C [facility] 3/4/20176:25 PM

## 2015-10-03 ENCOUNTER — Ambulatory Visit (INDEPENDENT_AMBULATORY_CARE_PROVIDER_SITE_OTHER): Payer: BC Managed Care – PPO | Admitting: Physician Assistant

## 2015-10-03 ENCOUNTER — Ambulatory Visit (INDEPENDENT_AMBULATORY_CARE_PROVIDER_SITE_OTHER): Payer: BC Managed Care – PPO

## 2015-10-03 VITALS — BP 108/72 | HR 86 | Temp 98.0°F | Resp 19 | Ht 66.0 in | Wt 266.8 lb

## 2015-10-03 DIAGNOSIS — M25462 Effusion, left knee: Secondary | ICD-10-CM

## 2015-10-03 DIAGNOSIS — M25562 Pain in left knee: Secondary | ICD-10-CM | POA: Diagnosis not present

## 2015-10-03 MED ORDER — MELOXICAM 15 MG PO TABS: 15.0000 mg | ORAL_TABLET | Freq: Every day | ORAL | Status: DC

## 2015-10-03 NOTE — Patient Instructions (Addendum)
IF you received an x-ray today, you will receive an invoice from [facility]. Please contact [facility] at [phone] with questions or concerns regarding your invoice.   IF you received labwork today, you will receive an invoice from [facility]. Please contact [facility] at [phone] with questions or concerns regarding your invoice.   Our billing staff will not be able to assist you with questions regarding bills from these companies.  You will be contacted with the lab results as soon as they are available. The fastest way to get your results is to activate your My Chart account. Instructions are located on the last page of this paperwork. If you have not heard from us regarding the results in 2 weeks, please contact this office.    Please take the mobic as prescribed.  Do not take naproxen or ibuprofen for your pain.  You can take tylenol.   Please choose three pictures and do them three times per day.  Do the stretches in 6 reps.  If it says to hold a position, hold it for 10 seconds. Please ice the knee 3 times per day for 15 minutes. Knee Effusion Knee effusion means that you have extra fluid in your knee. This can cause pain. Your knee may be more difficult to bend and move. HOME CARE  Use crutches as told by your doctor.  Wear a knee brace as told by your doctor.  Apply ice to the swollen area:  Put ice in a plastic bag.  Place a towel between your skin and the bag.  Leave the ice on for 20 minutes, 2-3 times per day.  Keep your knee raised (elevated) when you are sitting or lying down.  Take medicines only as told by your doctor.  Do any rehabilitation or strengthening exercises as told by your doctor.  Rest your knee as told by your doctor. You may start doing your normal activities again when your doctor says it is okay.  Keep all follow-up visits as told by your doctor. This is important. GET HELP IF:   You  continue to have pain in your knee. GET HELP RIGHT AWAY IF:  You have increased swelling or redness of your knee.  You have severe pain in your knee.  You have a fever.   This information is not intended to replace advice given to you by your health care provider. Make sure you discuss any questions you have with your health care provider.   Document Released: 07/28/2010 Document Revised: 07/16/2014 Document Reviewed: 02/08/2014 Elsevier Interactive Patient Education 2016 Elsevier Inc. Generic Knee Exercises EXERCISES RANGE OF MOTION (ROM) AND STRETCHING EXERCISES These exercises may help you when beginning to rehabilitate your injury. Your symptoms may resolve with or without further involvement from your physician, physical therapist, or athletic trainer. While completing these exercises, remember:   Restoring tissue flexibility helps normal motion to return to the joints. This allows healthier, less painful movement and activity.  An effective stretch should be held for at least 30 seconds.  A stretch should never be painful. You should only feel a gentle lengthening or release in the stretched tissue. STRETCH - Knee Extension, Prone  Lie on your stomach on a firm surface, such as a bed or countertop. Place your right / left knee and leg just beyond the edge of the surface. You may wish to place a towel under the far end of your right / left thigh for comfort.  Relax  your leg muscles and allow gravity to straighten your knee. Your clinician may advise you to add an ankle weight if more resistance is helpful for you.  You should feel a stretch in the back of your right / left knee. Hold this position for __________ seconds. Repeat __________ times. Complete this stretch __________ times per day. * Your physician, physical therapist, or athletic trainer may ask you to add ankle weight to enhance your stretch.  RANGE OF MOTION - Knee Flexion, Active  Lie on your back with both  knees straight. (If this causes back discomfort, bend your opposite knee, placing your foot flat on the floor.)  Slowly slide your heel back toward your buttocks until you feel a gentle stretch in the front of your knee or thigh.  Hold for __________ seconds. Slowly slide your heel back to the starting position. Repeat __________ times. Complete this exercise __________ times per day.  STRETCH - Quadriceps, Prone   Lie on your stomach on a firm surface, such as a bed or padded floor.  Bend your right / left knee and grasp your ankle. If you are unable to reach your ankle or pant leg, use a belt around your foot to lengthen your reach.  Gently pull your heel toward your buttocks. Your knee should not slide out to the side. You should feel a stretch in the front of your thigh and/or knee.  Hold this position for __________ seconds. Repeat __________ times. Complete this stretch __________ times per day.  STRETCH - Hamstrings, Supine   Lie on your back. Loop a belt or towel over the ball of your right / left foot.  Straighten your right / left knee and slowly pull on the belt to raise your leg. Do not allow the right / left knee to bend. Keep your opposite leg flat on the floor.  Raise the leg until you feel a gentle stretch behind your right / left knee or thigh. Hold this position for __________ seconds. Repeat __________ times. Complete this stretch __________ times per day.  STRENGTHENING EXERCISES These exercises may help you when beginning to rehabilitate your injury. They may resolve your symptoms with or without further involvement from your physician, physical therapist, or athletic trainer. While completing these exercises, remember:   Muscles can gain both the endurance and the strength needed for everyday activities through controlled exercises.  Complete these exercises as instructed by your physician, physical therapist, or athletic trainer. Progress the resistance and  repetitions only as guided.  You may experience muscle soreness or fatigue, but the pain or discomfort you are trying to eliminate should never worsen during these exercises. If this pain does worsen, stop and make certain you are following the directions exactly. If the pain is still present after adjustments, discontinue the exercise until you can discuss the trouble with your clinician. STRENGTH - Quadriceps, Isometrics  Lie on your back with your right / left leg extended and your opposite knee bent.  Gradually tense the muscles in the front of your right / left thigh. You should see either your knee cap slide up toward your hip or increased dimpling just above the knee. This motion will push the back of the knee down toward the floor/mat/bed on which you are lying.  Hold the muscle as tight as you can without increasing your pain for __________ seconds.  Relax the muscles slowly and completely in between each repetition. Repeat __________ times. Complete this exercise __________ times per day.  STRENGTH - Quadriceps, Short Arcs   Lie on your back. Place a __________ inch towel roll under your knee so that the knee slightly bends.  Raise only your lower leg by tightening the muscles in the front of your thigh. Do not allow your thigh to rise.  Hold this position for __________ seconds. Repeat __________ times. Complete this exercise __________ times per day.  OPTIONAL ANKLE WEIGHTS: Begin with ____________________, but DO NOT exceed ____________________. Increase in 1 pound/0.5 kilogram increments.  STRENGTH - Quadriceps, Straight Leg Raises  Quality counts! Watch for signs that the quadriceps muscle is working to insure you are strengthening the correct muscles and not "cheating" by substituting with healthier muscles.  Lay on your back with your right / left leg extended and your opposite knee bent.  Tense the muscles in the front of your right / left thigh. You should see either  your knee cap slide up or increased dimpling just above the knee. Your thigh may even quiver.  Tighten these muscles even more and raise your leg 4 to 6 inches off the floor. Hold for __________ seconds.  Keeping these muscles tense, lower your leg.  Relax the muscles slowly and completely in between each repetition. Repeat __________ times. Complete this exercise __________ times per day.  STRENGTH - Hamstring, Curls  Lay on your stomach with your legs extended. (If you lay on a bed, your feet may hang over the edge.)  Tighten the muscles in the back of your thigh to bend your right / left knee up to 90 degrees. Keep your hips flat on the bed/floor.  Hold this position for __________ seconds.  Slowly lower your leg back to the starting position. Repeat __________ times. Complete this exercise __________ times per day.  OPTIONAL ANKLE WEIGHTS: Begin with ____________________, but DO NOT exceed ____________________. Increase in 1 pound/0.5 kilogram increments.  STRENGTH - Quadriceps, Squats  Stand in a door frame so that your feet and knees are in line with the frame.  Use your hands for balance, not support, on the frame.  Slowly lower your weight, bending at the hips and knees. Keep your lower legs upright so that they are parallel with the door frame. Squat only within the range that does not increase your knee pain. Never let your hips drop below your knees.  Slowly return upright, pushing with your legs, not pulling with your hands. Repeat __________ times. Complete this exercise __________ times per day.  STRENGTH - Quadriceps, Wall Slides  Follow guidelines for form closely. Increased knee pain often results from poorly placed feet or knees.  Lean against a smooth wall or door and walk your feet out 18-24 inches. Place your feet hip-width apart.  Slowly slide down the wall or door until your knees bend __________ degrees.* Keep your knees over your heels, not your toes, and in  line with your hips, not falling to either side.  Hold for __________ seconds. Stand up to rest for __________ seconds in between each repetition. Repeat __________ times. Complete this exercise __________ times per day. * Your physician, physical therapist, or athletic trainer will alter this angle based on your symptoms and progress.   This information is not intended to replace advice given to you by your health care provider. Make sure you discuss any questions you have with your health care provider.   Document Released: 05/09/2005 Document Revised: 07/16/2014 Document Reviewed: 10/07/2008 Elsevier Interactive Patient Education [employer]

## 2015-10-03 NOTE — Progress Notes (Addendum)
[facility] [address], [place] [address] [phone]  Date:  10/03/2015   Name:  Wendy Morton   DOB:  May 25, 1991   MRN:  [id number]  PCP:  Default, Provider, MD    History of Present Illness:  Wendy Morton is a 25 y.o. female patient who [facility] 4 days of left knee pain. Patient reports nontraumatic knee pain for the last 4 days. It's located on the lateral side of the left knee. She is also noticed swelling it is painful to walk on it and with bending. She reports that this is been a recurrent problem for her. She was seen at urgent care and [facility]. She was given an MRI which she reports did not explain much of her symptoms. This is been going on since 5 months ago and has been recurrent. There was no trauma 5 months ago. She denies walking for long. Prior to this acute pain. She has no fever, malaise, fatigue, redness she has attempted ice which has not helped much. 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 She reports that there is a lot of erratic movement there she denies any weight loss.     There are no active problems to display for this patient.   Past Medical History  Diagnosis Date  . Asthma   . Strep throat     No past surgical history on file.  Social History  Substance Use Topics  . Smoking status: Never Smoker   . Smokeless tobacco: Never Used  . Alcohol Use: 0.0 oz/week    0 Standard drinks or equivalent per week     Comment: occasional per pt    Family History  Problem Relation Age of Onset  . Cancer Mother     Allergies  Allergen Reactions  . Tomato Shortness Of Breath    Medication list has been reviewed and updated.  Current Outpatient Prescriptions on File Prior to Visit  Medication Sig Dispense Refill  . ibuprofen (ADVIL,MOTRIN) 200 MG tablet Take 600 mg by mouth every 6 (six) hours as needed for mild pain or moderate pain. Reported on 09/10/2015    . albuterol (PROVENTIL  HFA;VENTOLIN HFA) 108 (90 BASE) MCG/ACT inhaler Inhale 1-2 puffs into the lungs every 6 (six) hours as needed for wheezing or shortness of breath. Reported on 10/03/2015    . EPINEPHrine (EPIPEN 2-PAK) 0.3 mg/0.3 mL IJ SOAJ injection Inject 0.3 mLs (0.3 mg total) into the muscle once as needed (for severe allergic reaction). CAll 911 immediately if you have to use this medicine (Patient not taking: Reported on 09/10/2015) 2 Device 1  . fluconazole (DIFLUCAN) 150 MG tablet Take 1 tablet (150 mg total) by mouth once. Repeat if needed (Patient not taking: Reported on 10/03/2015) 2 tablet 0  . HYDROcodone-homatropine (HYCODAN) 5-1.5 MG/5ML syrup Take 5 mLs by mouth at bedtime as needed. (Patient not taking: Reported on 09/10/2015) 120 mL 0  . Hydrocortisone (GERHARDT'S BUTT CREAM) CREA Apply 1 application topically 2 (two) times daily. (Patient not taking: Reported on 10/03/2015) 1 each 0   No current facility-administered medications on file prior to visit.    ROS ROS otherwise unremarkable unless listed above.  Physical Examination: BP 108/72 mmHg  Pulse 86  Temp(Src) 98 F (36.7 C) (Oral)  Resp 19  Ht 5\' 6"  (1.676 m)  Wt 266 lb 12.8 oz (121.02 kg)  BMI 43.08 kg/m2  SpO2 95%  LMP  09/19/2015 Ideal Body Weight: Weight in (lb) to have BMI = 25: 154.6 Wt Readings from Last 3 Encounters:  10/03/15 266 lb 12.8 oz (121.02 kg)  08/08/15 263 lb (119.296 kg)  12/04/14 235 lb (106.595 kg)    Physical Exam  Constitutional: She is oriented to person, place, and time. She appears well-developed and well-nourished. No distress.  HENT:  Head: Normocephalic and atraumatic.  Right Ear: External ear normal.  Left Ear: External ear normal.  Eyes: Conjunctivae and EOM are normal. Pupils are equal, round, and reactive to light.  Cardiovascular: Normal rate.   Pulmonary/Chest: Effort normal. No respiratory distress.  Musculoskeletal:  Left knee with lateral swelling and warmth just superior to patella.  This  is tender.  No erythema.  Pain with flexion.  Minimal lateral laxity and tender.  Decreased strength with flex/ext range of motion resistance.  Pain with resisted flex.  Meniscal pain.  Neurological: She is alert and oriented to person, place, and time.  Skin: She is not diaphoretic.  Psychiatric: She has a normal mood and affect. Her behavior is normal.     Assessment and Plan: Wendy Morton is a 25 y.o. female who is here today fo pain in her left knee This appears to be a recurrent issue, and less likely septic/infection.  Possibly due to body habitus and knee burden.  Diff dx: meniscal tear, degenerative disease, ligamental tear.  Given anti-inflammatory.  He declines referral to revisit with [facility].  If return may need to consider draining.  Advised ice, rest, and intermittent stretches to strengthen the extremity musculature. She will follow up in 1 week, if no improvement.  Knee effusion, left - Plan: DG Knee Complete 4 Views Left, meloxicam (MOBIC) 15 MG tablet  Pain and swelling of knee, left - Plan: DG Knee Complete 4 Views Left, meloxicam (MOBIC) 15 MG tablet  Trena Platt, PA-C [facility] 3/27/20179:10 PM

## 2015-10-05 NOTE — Addendum Note (Signed)
Addended by: Trena PlattENGLISH, STEPHANIE D on: 10/05/2015 01:17 PM   Modules accepted: Kipp BroodSmartSet

## 2015-10-10 ENCOUNTER — Ambulatory Visit (INDEPENDENT_AMBULATORY_CARE_PROVIDER_SITE_OTHER): Payer: BC Managed Care – PPO | Admitting: Family Medicine

## 2015-10-10 VITALS — BP 102/64 | HR 74 | Temp 97.9°F | Resp 18 | Ht 67.0 in | Wt 268.0 lb

## 2015-10-10 DIAGNOSIS — J45909 Unspecified asthma, uncomplicated: Secondary | ICD-10-CM | POA: Insufficient documentation

## 2015-10-10 DIAGNOSIS — J4520 Mild intermittent asthma, uncomplicated: Secondary | ICD-10-CM

## 2015-10-10 DIAGNOSIS — M25562 Pain in left knee: Secondary | ICD-10-CM | POA: Diagnosis not present

## 2015-10-10 MED ORDER — PREDNISONE 20 MG PO TABS: ORAL_TABLET | ORAL | Status: DC

## 2015-10-10 NOTE — Patient Instructions (Addendum)
Please call me if you are not improving by Wednesday morning.    IF you received an x-ray today, you will receive an invoice from [facility]. Please contact [facility] at [phone] with questions or concerns regarding your invoice.   IF you received labwork today, you will receive an invoice from [facility]. Please contact [facility] at [phone] with questions or concerns regarding your invoice.   Our billing staff will not be able to assist you with questions regarding bills from these companies.  You will be contacted with the lab results as soon as they are available. The fastest way to get your results is to activate your My Chart account. Instructions are located on the last page of this paperwork. If you have not heard from us regarding the results in 2 weeks, please contact this office.     \

## 2015-10-10 NOTE — Progress Notes (Signed)
From 3/27 note:  Expand All Collapse All   [facility] [address], [place] [address] [phone]  Date: 10/03/2015   Wendy Morton is a 25 y.o. female patient who [facility] days of left knee pain. Patient reports nontraumatic knee pain for the last 4 days. It's located on the lateral side of the left knee. She is also noticed swelling it is painful to walk on it and with bending. She reports that this is been a recurrent problem for her. She was seen at urgent care and was [facility]. She was given an MRI which she reports did not explain much of her symptoms. This is been going on since 5 months ago and has been recurrent. There was no trauma 5 months ago. She denies walking for long. Prior to this acute pain. She has no fever, malaise, fatigue, redness she has attempted ice which has not helped much. 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 She reports that there is a lot of erratic movement there she denies any weight loss.      Patient has had intermittent left knee pain for 5 months. 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 This interval of pain has lasted about 10 days. She is not improving on the meloxicam.   Objective: No acute distress, overweight woman BP 102/64 mmHg  Pulse 74  Temp(Src) 97.9 F (36.6 C) (Oral)  Resp 18  Ht 5\' 7"  (1.702 m)  Wt 268 lb (121.564 kg)  BMI 41.96 kg/m2  SpO2 97%  LMP 09/19/2015 Examination left knee reveals no effusion. She is mildly tender along the lateral aspect the joint line. There is no bony abnormality.  X-rays reviewed: Normal  Assessment: Mild internal derangement of the left knee, probably cartilage  Plan: Penicillin 40 mg daily for the next 2-3 days. Patient call me if she's not better in which case I will refer her to Ollen GrossFrank Aluisio, MD  Signed, Sheila OatsKurt Zackarie Chason M.D.

## 2015-11-28 ENCOUNTER — Ambulatory Visit (INDEPENDENT_AMBULATORY_CARE_PROVIDER_SITE_OTHER): Payer: BC Managed Care – PPO | Admitting: Physician Assistant

## 2015-11-28 VITALS — BP 118/80 | HR 84 | Temp 98.1°F | Resp 16 | Ht 65.0 in | Wt 265.0 lb

## 2015-11-28 DIAGNOSIS — N760 Acute vaginitis: Secondary | ICD-10-CM

## 2015-11-28 DIAGNOSIS — B9689 Other specified bacterial agents as the cause of diseases classified elsewhere: Secondary | ICD-10-CM

## 2015-11-28 DIAGNOSIS — Z124 Encounter for screening for malignant neoplasm of cervix: Secondary | ICD-10-CM

## 2015-11-28 DIAGNOSIS — A499 Bacterial infection, unspecified: Secondary | ICD-10-CM

## 2015-11-28 DIAGNOSIS — N898 Other specified noninflammatory disorders of vagina: Secondary | ICD-10-CM | POA: Diagnosis not present

## 2015-11-28 LAB — POCT URINALYSIS DIP (MANUAL ENTRY)
Bilirubin, UA: NEGATIVE
GLUCOSE UA: NEGATIVE
Ketones, POC UA: NEGATIVE
Nitrite, UA: NEGATIVE
PH UA: 5.5
PROTEIN UA: NEGATIVE
RBC UA: NEGATIVE
Spec Grav, UA: <=1.005
UROBILINOGEN UA: 0.2

## 2015-11-28 LAB — POCT WET + KOH PREP
Trich by wet prep: ABSENT
Yeast by KOH: ABSENT
Yeast by wet prep: ABSENT

## 2015-11-28 MED ORDER — METRONIDAZOLE 500 MG PO TABS: 500.0000 mg | ORAL_TABLET | Freq: Two times a day (BID) | ORAL | Status: DC

## 2015-11-28 NOTE — Progress Notes (Signed)
11/28/2015 5:44 PM   DOB: Oct 02, 1990 / MRN: [id number]  SUBJECTIVE:  Wendy Morton is a 25 y.o. female presenting for vaginal discomfort that started 24 hours ago. She does not know what color the discharge is and denies any odor.  Complains of mild itching.  Denies dysuria, urgency, frequency and flank pain. She has tried taking two macrobid for her symptoms and this did not help. She feels she is getting worse.  She is in a same sex monogamous relationship for over a year now.    She has not had a pap smear in greater than three years.     She is allergic to tomato.   She  has a past medical history of Asthma and Strep throat.    She  reports that she has never smoked. She has never used smokeless tobacco. She reports that she drinks alcohol. She reports that she does not use illicit drugs. She  has no sexual activity history on file. The patient  has no past surgical history on file.  Her family history includes Cancer in her mother.  Review of Systems  Constitutional: Negative for fever and chills.  Eyes: Negative for blurred vision.  Respiratory: Negative for cough and shortness of breath.   Cardiovascular: Negative for chest pain.  Gastrointestinal: Negative for nausea, vomiting and abdominal pain.  Genitourinary: Negative for dysuria, urgency and frequency.  Musculoskeletal: Negative for myalgias.  Skin: Negative for rash.  Neurological: Negative for dizziness, tingling and headaches.  Psychiatric/Behavioral: Negative for depression. The patient is not nervous/anxious.     Problem list and medications reviewed and updated by myself where necessary, and exist elsewhere in the encounter.   OBJECTIVE:  BP 118/80 mmHg  Pulse 84  Temp(Src) 98.1 F (36.7 C) (Oral)  Resp 16  Ht 5\' 5"  (1.651 m)  Wt 265 lb (120.203 kg)  BMI 44.10 kg/m2  SpO2 100%  LMP 11/21/2015 (Approximate)  Physical Exam  Constitutional: She is oriented to person, place, and time. She appears  well-nourished. No distress.  Eyes: EOM are normal. Pupils are equal, round, and reactive to light.  Cardiovascular: Normal rate.   Pulmonary/Chest: Effort normal.  Abdominal: She exhibits no distension.  Genitourinary: Cervix exhibits no motion tenderness. No erythema, tenderness or bleeding in the vagina. No foreign body around the vagina. No signs of injury around the vagina. Vaginal discharge (white, thin) found.  Neurological: She is alert and oriented to person, place, and time. No cranial nerve deficit. Gait normal.  Skin: Skin is dry. She is not diaphoretic.  Psychiatric: She has a normal mood and affect.  Vitals reviewed.   Results for orders placed or performed in visit on 11/28/15 (from the past 72 hour(s))  POCT Wet + KOH Prep     Status: Abnormal   Collection Time: 11/28/15  5:42 PM  Result Value Ref Range   Yeast by KOH Absent Present, Absent   Yeast by wet prep Absent Present, Absent   WBC by wet prep Moderate (A) None, Few, Too numerous to count   Clue Cells Wet Prep HPF POC Moderate (A) None, Too numerous to count   Trich by wet prep Absent Present, Absent   Bacteria Wet Prep HPF POC Many (A) None, Few, Too numerous to count   Epithelial Cells By [employer]et Pref ([facility]) Few None, Few, Too numerous to count   RBC,UR,HPF,POC None None RBC/hpf  POCT urinalysis dipstick     Status: Abnormal   Collection Time: 11/28/15  5:42  PM  Result Value Ref Range   Color, UA yellow yellow   Clarity, UA clear clear   Glucose, UA negative negative   Bilirubin, UA negative negative   Ketones, POC UA negative negative   Spec Grav, UA <=1.005    Blood, UA negative negative   pH, UA 5.5    Protein Ur, POC negative negative   Urobilinogen, UA 0.2    Nitrite, UA Negative Negative   Leukocytes, UA Trace (A) Negative    No results found.  ASSESSMENT AND PLAN  Wendy Morton was seen today for vaginal discomfort.  Diagnoses and all orders for this visit:  Vaginal discharge: Most likely BV.  She  does have some leuks in her urine however she is not having any UTI symptoms.  Will hold off on UTI therapy for now given we have a source that explains her symtpoms.  -     POCT Wet + KOH Prep -     POCT urinalysis dipstick  Screening for cervical cancer -     Pap IG, CT/NG w/ reflex HPV when ASC-U  BV (bacterial vaginosis) -     metroNIDAZOLE (FLAGYL) 500 MG tablet; Take 1 tablet (500 mg total) by mouth 2 (two) times daily. Do not consume alcohol with this medication.    The patient was advised to call or return to clinic if she does not see an improvement in symptoms or to seek the care of the closest emergency department if she worsens with the above plan.   Deliah Boston, MHS, PA-C [facility] 11/28/2015 5:44 PM

## 2015-11-28 NOTE — Patient Instructions (Signed)
     IF you received an x-ray today, you will receive an invoice from [facility]. Please contact [facility] at [phone] with questions or concerns regarding your invoice.   IF you received labwork today, you will receive an invoice from [facility]. Please contact [facility] at [phone] with questions or concerns regarding your invoice.   Our billing staff will not be able to assist you with questions regarding bills from these companies.  You will be contacted with the lab results as soon as they are available. The fastest way to get your results is to activate your My Chart account. Instructions are located on the last page of this paperwork. If you have not heard from us regarding the results in 2 weeks, please contact this office.      

## 2015-11-29 LAB — PAP IG, CT-NG, RFX HPV ASCU
Chlamydia Probe Amp: NOT DETECTED
GC PROBE AMP: NOT DETECTED

## 2015-12-08 ENCOUNTER — Telehealth: Payer: Self-pay

## 2015-12-08 MED ORDER — FLUCONAZOLE 150 MG PO TABS: 150.0000 mg | ORAL_TABLET | Freq: Once | ORAL | Status: DC

## 2015-12-08 NOTE — Telephone Encounter (Signed)
Pt was seen here on 5/22 by Deliah BostonMichael Clark for Vaginal discharge - Primary. She was prescribed metroNIDAZOLE (FLAGYL) 500 MG tablet [[id number]][[id number]] and feels this has given her a yeast infection. She would like you to prescribe her Diflucan. [facility] [place] [address]. Please advise at [phone]

## 2015-12-08 NOTE — Telephone Encounter (Signed)
Diflucan 150 po once.  #2. Repeat in 3 days if needed. Please call in and call patient back.  Deliah BostonMichael Cliford Sequeira, MS, PA-C 12:58 PM, 12/08/2015

## 2015-12-08 NOTE — Telephone Encounter (Signed)
Sent in Rx, and operator advised pt.

## 2016-02-02 ENCOUNTER — Encounter (HOSPITAL_COMMUNITY): Payer: Self-pay | Admitting: Emergency Medicine

## 2016-02-02 ENCOUNTER — Ambulatory Visit (HOSPITAL_COMMUNITY)
Admission: EM | Admit: 2016-02-02 | Discharge: 2016-02-02 | Disposition: A | Discharge status: Home / Self Care | Payer: BC Managed Care – PPO | Attending: Family Medicine | Admitting: Family Medicine

## 2016-02-02 DIAGNOSIS — N760 Acute vaginitis: Secondary | ICD-10-CM | POA: Diagnosis not present

## 2016-02-02 DIAGNOSIS — A499 Bacterial infection, unspecified: Secondary | ICD-10-CM

## 2016-02-02 DIAGNOSIS — B9689 Other specified bacterial agents as the cause of diseases classified elsewhere: Secondary | ICD-10-CM

## 2016-02-02 DIAGNOSIS — L299 Pruritus, unspecified: Secondary | ICD-10-CM | POA: Diagnosis present

## 2016-02-02 MED ORDER — METRONIDAZOLE 500 MG PO TABS
500.0000 mg | ORAL_TABLET | Freq: Two times a day (BID) | ORAL | 0 refills | Status: DC
Start: 2016-02-02 — End: 2018-03-10

## 2016-02-02 MED ORDER — FLUCONAZOLE 150 MG PO TABS: 150.0000 mg | ORAL_TABLET | Freq: Once | ORAL | 0 refills | Status: AC

## 2016-02-02 NOTE — ED Provider Notes (Signed)
[facility]    CSN: [id number] Arrival date & time: 02/02/16  1652  First Provider Contact:  None       History   Chief Complaint Chief Complaint  Patient presents with  . Vaginal Itching    HPI Wendy Morton is a 25 y.o. female.   HPI   Currently w/ vaginal irritation/itching LMP 10 days ago No discharge No recent ABX Last intercourse 2 wks ago.  States that she gets recurrent BV and this is typical for her BV. Denies any dysuria, frequency, lower, pain, fevers, rash, back pain.      Past Medical History:  Diagnosis Date  . Asthma   . Strep throat     Patient Active Problem List   Diagnosis Date Noted  . Extrinsic asthma 10/10/2015    History reviewed. No pertinent surgical history.  OB History    No data available       Home Medications    Prior to Admission medications   Medication Sig Start Date End Date Taking? Authorizing Provider  albuterol (PROVENTIL HFA;VENTOLIN HFA) 108 (90 BASE) MCG/ACT inhaler Inhale 1-2 puffs into the lungs every 6 (six) hours as needed for wheezing or shortness of breath. Reported on 10/03/2015    Historical Provider, MD  EPINEPHrine (EPIPEN 2-PAK) 0.3 mg/0.3 mL IJ SOAJ injection Inject 0.3 mLs (0.3 mg total) into the muscle once as needed (for severe allergic reaction). CAll 911 immediately if you have to use this medicine 08/13/14   Dahlia Client Muthersbaugh, PA-C  fluconazole (DIFLUCAN) 150 MG tablet Take 1 tablet (150 mg total) by mouth once. Repeat in 3 days if needed. 12/08/15   Ofilia Neas, PA-C  metroNIDAZOLE (FLAGYL) 500 MG tablet Take 1 tablet (500 mg total) by mouth 2 (two) times daily. Do not consume alcohol with this medication. 11/28/15   Ofilia Neas, PA-C    Family History Family History  Problem Relation Age of Onset  . Cancer Mother     Social History Social History  Substance Use Topics  . Smoking status: Never Smoker  . Smokeless tobacco: Never Used  . Alcohol use 0.0 oz/week   Comment: occasional per pt     Allergies   Tomato   Review of Systems Review of Systems Per HPI with all other pertinent systems negative.    Physical Exam Triage Vital Signs ED Triage Vitals [02/02/16 1736]  Enc Vitals Group     BP 121/81     Pulse Rate 89     Resp 16     Temp 97.9 F (36.6 C)     Temp Source Oral     SpO2 100 %     Weight      Height      Head Circumference      Peak Flow      Pain Score      Pain Loc      Pain Edu?      Excl. in GC?    No data found.   Updated Vital Signs BP 121/81 (BP Location: Left Arm)   Pulse 89   Temp 97.9 F (36.6 C) (Oral)   Resp 16   LMP 01/17/2016   SpO2 100%   Visual Acuity Right Eye Distance:   Left Eye Distance:   Bilateral Distance:    Right Eye Near:   Left Eye Near:    Bilateral Near:     Physical Exam Physical Exam  Constitutional: oriented to person, place, and time.  appears well-developed and well-nourished. No distress.  HENT:  Head: Normocephalic and atraumatic.  Eyes: EOMI. PERRL.  Neck: Normal range of motion.  Cardiovascular: RRR, no m/r/g, 2+ distal pulses,  Pulmonary/Chest: Effort normal and breath sounds normal. No respiratory distress.  Abdominal: Soft. Bowel sounds are normal. NonTTP, no distension.  Musculoskeletal: Normal range of motion. Non ttp, no effusion.  Neurological: alert and oriented to person, place, and time.  Skin: Skin is warm. No rash noted. non diaphoretic.  Psychiatric: normal mood and affect. behavior is normal. Judgment and thought content normal.    UC Treatments / Results  Labs (all labs ordered are listed, but only abnormal results are displayed) Labs Reviewed - No data to display  EKG  EKG Interpretation None       Radiology No results found.  Procedures Procedures (including critical care time)  Medications Ordered in UC Medications - No data to display   Initial Impression / Assessment and Plan / UC Course  I have reviewed the triage  vital signs and the nursing notes.  Pertinent labs & imaging results that were available during my care of the patient were reviewed by me and considered in my medical decision making (see chart for details).  Clinical Course    Vaginitis. Likely BV. Start Flagyl. Diflucan if develops [place] infection. Will send STD panel.  Final Clinical Impressions(s) / UC Diagnoses   Final diagnoses:  None    New Prescriptions New Prescriptions   No medications on file     Ozella Rocks, MD 02/02/16 [phone]

## 2016-02-02 NOTE — ED Triage Notes (Signed)
Patient c/o vaginal irritation after her last menstrual cycle. Some itching and discharge. Denies odor or pain.

## 2016-02-02 NOTE — Discharge Instructions (Signed)
It is likely that you have developed BV again. Please start metronidazole as prescribed. Please use the Diflucan if you develop a yeast infection. If you develop ED symptoms again he may need to go on a prolonged treatment course.

## 2016-02-03 LAB — CERVICOVAGINAL ANCILLARY ONLY
Chlamydia: NEGATIVE
NEISSERIA GONORRHEA: NEGATIVE
WET PREP (BD AFFIRM): NEGATIVE

## 2017-04-06 ENCOUNTER — Emergency Department (HOSPITAL_COMMUNITY)
Admission: EM | Admit: 2017-04-06 | Discharge: 2017-04-07 | Disposition: A | Discharge status: Home / Self Care | Payer: BC Managed Care – PPO | Attending: Emergency Medicine | Admitting: Emergency Medicine

## 2017-04-06 ENCOUNTER — Encounter (HOSPITAL_COMMUNITY): Payer: Self-pay

## 2017-04-06 DIAGNOSIS — J069 Acute upper respiratory infection, unspecified: Secondary | ICD-10-CM | POA: Diagnosis not present

## 2017-04-06 DIAGNOSIS — Z79899 Other long term (current) drug therapy: Secondary | ICD-10-CM | POA: Insufficient documentation

## 2017-04-06 DIAGNOSIS — F17200 Nicotine dependence, unspecified, uncomplicated: Secondary | ICD-10-CM | POA: Insufficient documentation

## 2017-04-06 DIAGNOSIS — B9789 Other viral agents as the cause of diseases classified elsewhere: Secondary | ICD-10-CM | POA: Insufficient documentation

## 2017-04-06 DIAGNOSIS — J40 Bronchitis, not specified as acute or chronic: Secondary | ICD-10-CM | POA: Diagnosis not present

## 2017-04-06 DIAGNOSIS — J4521 Mild intermittent asthma with (acute) exacerbation: Secondary | ICD-10-CM | POA: Diagnosis present

## 2017-04-06 MED ORDER — ALBUTEROL SULFATE (2.5 MG/3ML) 0.083% IN NEBU
5.0000 mg | INHALATION_SOLUTION | Freq: Once | RESPIRATORY_TRACT | Status: AC
  Administered 2017-04-06: 5 mg via RESPIRATORY_TRACT
  Filled 2017-04-06: qty 6

## 2017-04-06 MED ORDER — IPRATROPIUM BROMIDE 0.02 % IN SOLN
0.5000 mg | RESPIRATORY_TRACT | Status: AC
  Administered 2017-04-06: 0.5 mg via RESPIRATORY_TRACT
  Filled 2017-04-06: qty 2.5

## 2017-04-06 MED ORDER — PREDNISONE 20 MG PO TABS
60.0000 mg | ORAL_TABLET | Freq: Once | ORAL | Status: AC
  Administered 2017-04-06: 60 mg via ORAL
  Filled 2017-04-06: qty 3

## 2017-04-06 NOTE — ED Triage Notes (Signed)
Pt c/o asthma exacerbation x 2 days, cough, used MDI today, also c/o sore throat with reported while patches, otalgia, yellow nasal drainage.  Pt does not appear to be in distress but does have insp and exp wheezing.

## 2017-04-07 ENCOUNTER — Emergency Department (HOSPITAL_COMMUNITY): Payer: BC Managed Care – PPO

## 2017-04-07 LAB — RAPID STREP SCREEN (MED CTR MEBANE ONLY): Streptococcus, Group A Screen (Direct): NEGATIVE

## 2017-04-07 MED ORDER — AZITHROMYCIN 250 MG PO TABS: 250.0000 mg | ORAL_TABLET | Freq: Every day | ORAL | 0 refills | Status: DC

## 2017-04-07 MED ORDER — PREDNISONE 50 MG PO TABS: 50.0000 mg | ORAL_TABLET | Freq: Every day | ORAL | 0 refills | Status: DC

## 2017-04-07 MED ORDER — PROMETHAZINE-DM 6.25-15 MG/5ML PO SYRP: 5.0000 mL | ORAL_SOLUTION | Freq: Four times a day (QID) | ORAL | 0 refills | Status: DC | PRN

## 2017-04-07 NOTE — Discharge Instructions (Signed)
Return here as needed.  Increase your fluid intake and rest as much possible.  Your x-ray did not show any signs of pneumonia and your strep test was negative

## 2017-04-10 LAB — CULTURE, GROUP A STREP (THRC)

## 2017-04-11 NOTE — ED Provider Notes (Signed)
[facility] Provider Note   CSN: [id number] Arrival date & time: 04/06/17  2158     History   Chief Complaint Chief Complaint  Patient presents with  . Asthma    HPI Wendy Morton is a 26 y.o. female.  HPI Patient presents to the emergency department with asthma exacerbation that is been ongoing over the last 2 days due to the fact that she has had a recent cough, sore throat, ear pain and nasal drainage.  Patient states that she did not take any medications prior to arrival.  States nothing seems make this better or worse. she gets this several times a year.  Patient states that she has not had any nausea, vomiting, fever, weakness, dizziness, headache, blurred vision, dysuria, abdominal pain, back pain, neck pain, lethargy, or syncope Past Medical History:  Diagnosis Date  . Asthma   . Strep throat     Patient Active Problem List   Diagnosis Date Noted  . Extrinsic asthma 10/10/2015    History reviewed. No pertinent surgical history.  OB History    No data available       Home Medications    Prior to Admission medications   Medication Sig Start Date End Date Taking? Authorizing Provider  albuterol (PROVENTIL HFA;VENTOLIN HFA) 108 (90 BASE) MCG/ACT inhaler Inhale 1-2 puffs into the lungs every 6 (six) hours as needed for wheezing or shortness of breath. Reported on 10/03/2015    [provider]  azithromycin (ZITHROMAX) 250 MG tablet Take 1 tablet (250 mg total) by mouth daily. 04/07/17   Josphine Laffey, Cristal Deer, PA-C  EPINEPHrine (EPIPEN 2-PAK) 0.3 mg/0.3 mL IJ SOAJ injection Inject 0.3 mLs (0.3 mg total) into the muscle once as needed (for severe allergic reaction). CAll 911 immediately if you have to use this medicine 08/13/14   Muthersbaugh, Dahlia Client, PA-C  metroNIDAZOLE (FLAGYL) 500 MG tablet Take 1 tablet (500 mg total) by mouth 2 (two) times daily. Do not consume alcohol with this medication. 02/02/16   Ozella Rocks, MD  predniSONE (DELTASONE) 50 MG  tablet Take 1 tablet (50 mg total) by mouth daily. 04/07/17   [place] Cawood, Cristal Deer, PA-C  promethazine-dextromethorphan (PROMETHAZINE-DM) 6.25-15 MG/5ML syrup Take 5 mLs by mouth 4 (four) times daily as needed for cough. 04/07/17   Charlestine Night, PA-C    Family History Family History  Problem Relation Age of Onset  . Cancer Mother     Social History Social History  Substance Use Topics  . Smoking status: Current Some Day Smoker  . Smokeless tobacco: Never Used  . Alcohol use 0.0 oz/week     Comment: occasional per pt     Allergies   Tomato   Review of Systems Review of Systems All other systems negative except as documented in the HPI. All pertinent positives and negatives as reviewed in the HPI.  Physical Exam Updated Vital Signs BP 123/71 (BP Location: Right Arm)   Pulse 97   Temp 99 F (37.2 C) (Oral)   Resp 18   Ht  (1.702 m)   Wt 106.1 kg (234 lb)   LMP 03/19/2017   SpO2 100%   BMI 36.65 kg/m   Physical Exam  Constitutional: She is oriented to person, place, and time. She appears well-developed and well-nourished. No distress.  HENT:  Head: Normocephalic and atraumatic.  Mouth/Throat: Oropharynx is clear and moist.  Eyes: Pupils are equal, round, and reactive to light.  Neck: Normal range of motion. Neck supple.  Cardiovascular: Normal rate, regular  rhythm and normal heart sounds.  Exam reveals no gallop and no friction rub.   No murmur heard. Pulmonary/Chest: Effort normal. No respiratory distress. She has wheezes. She has no rales. She exhibits no tenderness.  Neurological: She is alert and oriented to person, place, and time. She exhibits normal muscle tone. Coordination normal.  Skin: Skin is warm and dry. Capillary refill takes less than 2 seconds. No rash noted. No erythema.  Psychiatric: She has a normal mood and affect. Her behavior is normal.  Nursing note and vitals reviewed.    ED Treatments / Results  Labs (all labs ordered are  listed, but only abnormal results are displayed) Labs Reviewed  RAPID STREP SCREEN (NOT AT [address])  CULTURE, GROUP A STREP [facility])    EKG  EKG Interpretation None       Radiology No results found.  Procedures Procedures (including critical care time)  Medications Ordered in ED Medications  albuterol (PROVENTIL) (2.5 MG/3ML) 0.083% nebulizer solution 5 mg (5 mg Nebulization Given 04/06/17 2345)  ipratropium (ATROVENT) nebulizer solution 0.5 mg (0.5 mg Nebulization Given 04/06/17 2345)  predniSONE (DELTASONE) tablet 60 mg (60 mg Oral Given 04/06/17 2344)     Initial Impression / Assessment and Plan / ED Course  I have reviewed the triage vital signs and the nursing notes.  Pertinent labs & imaging results that were available during my care of the patient were reviewed by me and considered in my medical decision making (see chart for details).     Patient is given of albuterol and Atrovent along with steroids.  She is improved following this.  We will have her follow-up with her primary doctor, told to return here as needed.  Patient agrees the plan and all questions were answered.  She will be treated for an upper respiratory infection that is exacerbated her asthma  Final Clinical Impressions(s) / ED Diagnoses   Final diagnoses:  Viral URI with cough  Mild intermittent asthma with exacerbation  Bronchitis    New Prescriptions Discharge Medication List as of 04/07/2017  1:16 AM    START taking these medications   Details  azithromycin (ZITHROMAX) 250 MG tablet Take 1 tablet (250 mg total) by mouth daily., Starting Sun 04/07/2017, Print    predniSONE (DELTASONE) 50 MG tablet Take 1 tablet (50 mg total) by mouth daily., Starting Sun 04/07/2017, Print    promethazine-dextromethorphan (PROMETHAZINE-DM) 6.25-15 MG/5ML syrup Take 5 mLs by mouth 4 (four) times daily as needed for cough., Starting Sun 04/07/2017, Print         Meklit Cotta, [place], PA-C 04/11/17 7829      Nira Conn, MD 04/12/17 0010

## 2017-08-06 ENCOUNTER — Encounter (HOSPITAL_COMMUNITY): Payer: Self-pay

## 2017-08-06 ENCOUNTER — Emergency Department (HOSPITAL_COMMUNITY): Payer: BC Managed Care – PPO

## 2017-08-06 ENCOUNTER — Other Ambulatory Visit: Payer: Self-pay

## 2017-08-06 ENCOUNTER — Emergency Department (HOSPITAL_COMMUNITY)
Admission: EM | Admit: 2017-08-06 | Discharge: 2017-08-06 | Disposition: A | Discharge status: Home / Self Care | Payer: BC Managed Care – PPO | Attending: Emergency Medicine | Admitting: Emergency Medicine

## 2017-08-06 DIAGNOSIS — J45909 Unspecified asthma, uncomplicated: Secondary | ICD-10-CM | POA: Diagnosis not present

## 2017-08-06 DIAGNOSIS — Z79899 Other long term (current) drug therapy: Secondary | ICD-10-CM | POA: Diagnosis not present

## 2017-08-06 DIAGNOSIS — M5410 Radiculopathy, site unspecified: Secondary | ICD-10-CM

## 2017-08-06 DIAGNOSIS — F17200 Nicotine dependence, unspecified, uncomplicated: Secondary | ICD-10-CM | POA: Insufficient documentation

## 2017-08-06 DIAGNOSIS — R079 Chest pain, unspecified: Secondary | ICD-10-CM | POA: Diagnosis not present

## 2017-08-06 LAB — BASIC METABOLIC PANEL
ANION GAP: 8 (ref 5–15)
BUN: 9 mg/dL (ref 6–20)
CALCIUM: 9.5 mg/dL (ref 8.9–10.3)
CO2: 25 mmol/L (ref 22–32)
Chloride: 107 mmol/L (ref 101–111)
Creatinine, Ser: 0.69 mg/dL (ref 0.44–1.00)
GFR calc non Af Amer: >60 mL/min (ref >60)
Glucose, Bld: 93 mg/dL (ref 65–99)
POTASSIUM: 4 mmol/L (ref 3.5–5.1)
Sodium: 140 mmol/L (ref 135–145)

## 2017-08-06 LAB — CBC
HCT: 40.9 % (ref 36.0–46.0)
HEMOGLOBIN: 13.2 g/dL (ref 12.0–15.0)
MCH: 26.8 pg (ref 26.0–34.0)
MCHC: 32.3 g/dL (ref 30.0–36.0)
MCV: 83.1 fL (ref 78.0–100.0)
Platelets: 243 10*3/uL (ref 150–400)
RBC: 4.92 MIL/uL (ref 3.87–5.11)
RDW: 15.2 % (ref 11.5–15.5)
WBC: 5.5 10*3/uL (ref 4.0–10.5)

## 2017-08-06 LAB — I-STAT BETA HCG BLOOD, ED (MC, WL, AP ONLY): I-stat hCG, quantitative: <5 m[IU]/mL (ref <5)

## 2017-08-06 LAB — I-STAT TROPONIN, ED: TROPONIN I, POC: 0 ng/mL (ref 0.00–0.08)

## 2017-08-06 MED ORDER — METHOCARBAMOL 500 MG PO TABS: 1000.0000 mg | ORAL_TABLET | Freq: Four times a day (QID) | ORAL | 0 refills | Status: DC | PRN

## 2017-08-06 NOTE — ED Notes (Signed)
Urine sample held in mini lab. 

## 2017-08-06 NOTE — ED Provider Notes (Signed)
[facility] EMERGENCY DEPARTMENT Provider Note   CSN: [id number] Arrival date & time: 08/06/17  4098     History   Chief Complaint Chief Complaint  Patient presents with  . Leg Pain  . Chest Pain    HPI SUN KIHN is a 27 y.o. female.  HPI   26yo female with history of asthma presents with concern for 2 days of left leg pain, sharp, centered on lateral aspect of thigh with radiation to feet/toes. Pain 10/10.  Yesterday, had sharp substernal chest pain, radiating to arms bilaterally.  Similar sensation to leg.  CP worse leaning forward. No change in pain with laughing, coughing or straining.  No nausea or vomiting. Had URI 2 weeks ago. No trauma, works out regularly, no increase.  Tried ibuprofen without relief. Denies shortness of breath. NO hx of leg swelling. No hx of DVT/PE, no hx of estrogen use, recent travel/surgery/or immobilization.  No fam hx of CAD. No smoking. No numbness or weakness nor loss control bowel or bladder.  No trauma.    Past Medical History:  Diagnosis Date  . Asthma   . Strep throat     Patient Active Problem List   Diagnosis Date Noted  . Extrinsic asthma 10/10/2015    History reviewed. No pertinent surgical history.  OB History    No data available       Home Medications    Prior to Admission medications   Medication Sig Start Date End Date Taking? Authorizing Provider  albuterol (PROVENTIL HFA;VENTOLIN HFA) 108 (90 BASE) MCG/ACT inhaler Inhale 1-2 puffs into the lungs every 6 (six) hours as needed for wheezing or shortness of breath. Reported on 10/03/2015    [provider]  azithromycin (ZITHROMAX) 250 MG tablet Take 1 tablet (250 mg total) by mouth daily. 04/07/17   Lawyer, Cristal Deer, PA-C  EPINEPHrine (EPIPEN 2-PAK) 0.3 mg/0.3 mL IJ SOAJ injection Inject 0.3 mLs (0.3 mg total) into the muscle once as needed (for severe allergic reaction). CAll 911 immediately if you have to use this medicine 08/13/14    Muthersbaugh, Dahlia Client, PA-C  methocarbamol (ROBAXIN) 500 MG tablet Take 2 tablets (1,000 mg total) by mouth every 6 (six) hours as needed for muscle spasms. 08/06/17   Alvira Monday, MD  metroNIDAZOLE (FLAGYL) 500 MG tablet Take 1 tablet (500 mg total) by mouth 2 (two) times daily. Do not consume alcohol with this medication. 02/02/16   Ozella Rocks, MD  predniSONE (DELTASONE) 50 MG tablet Take 1 tablet (50 mg total) by mouth daily. 04/07/17   Lawyer, Cristal Deer, PA-C  promethazine-dextromethorphan (PROMETHAZINE-DM) 6.25-15 MG/5ML syrup Take 5 mLs by mouth 4 (four) times daily as needed for cough. 04/07/17   Charlestine Night, PA-C    Family History Family History  Problem Relation Age of Onset  . Cancer Mother     Social History Social History   Tobacco Use  . Smoking status: Current Some Day Smoker  . Smokeless tobacco: Never Used  Substance Use Topics  . Alcohol use: Yes    Alcohol/week: 0.0 oz    Comment: occasional per pt  . Drug use: No     Allergies   Tomato   Review of Systems Review of Systems  Constitutional: Negative for fever.  HENT: Negative for sore throat.   Eyes: Negative for visual disturbance.  Respiratory: Negative for cough and shortness of breath.   Cardiovascular: Positive for chest pain. Negative for leg swelling.  Gastrointestinal: Negative for abdominal pain, nausea and  vomiting.  Genitourinary: Negative for difficulty urinating.  Musculoskeletal: Positive for arthralgias and myalgias. Negative for back pain and neck pain.  Skin: Negative for rash.  Neurological: Negative for syncope and headaches.     Physical Exam Updated Vital Signs BP (!) 113/99   Pulse 68   Temp 98 F (36.7 C) (Oral)   Resp 20   Ht 5\' 7"  (1.702 m)   Wt 102.5 kg (226 lb)   LMP 07/27/2017   SpO2 99%   BMI 35.40 kg/m   Physical Exam  Constitutional: She is oriented to person, place, and time. She appears well-developed and well-nourished. No distress.    HENT:  Head: Normocephalic and atraumatic.  Eyes: Conjunctivae and EOM are normal.  Neck: Normal range of motion.  Cardiovascular: Normal rate, regular rhythm, normal heart sounds and intact distal pulses. Exam reveals no gallop and no friction rub.  No murmur heard. Pulmonary/Chest: Effort normal and breath sounds normal. No respiratory distress. She has no wheezes. She has no rales.  Chest wall tenderness  Abdominal: Soft. She exhibits no distension. There is no tenderness. There is no guarding.  Musculoskeletal: She exhibits no edema.       Left upper leg: She exhibits tenderness (3cm area lateral thigh with tenderness, slight swelling, no sign of abscess or cellulitis).       Right lower leg: She exhibits no edema.       Left lower leg: She exhibits no edema.  Neurological: She is alert and oriented to person, place, and time.  Skin: Skin is warm and dry. No rash noted. She is not diaphoretic. No erythema.  Nursing note and vitals reviewed.    ED Treatments / Results  Labs (all labs ordered are listed, but only abnormal results are displayed) Labs Reviewed  BASIC METABOLIC PANEL  CBC  I-STAT TROPONIN, ED  I-STAT BETA HCG BLOOD, ED ([facility], [facility], [facility] ONLY)    EKG  EKG Interpretation  Date/Time:  Tuesday August 06 2017 09:53:19 EST Ventricular Rate:  85 PR Interval:  190 QRS Duration: 72 QT Interval:  362 QTC Calculation: 430 R Axis:   70 Text Interpretation:  Normal sinus rhythm Normal ECG No significant change since last tracing Confirmed by Alvira Monday ([id number]) on 08/06/2017 11:49:26 AM       Radiology Dg Chest 2 View  Result Date: 08/06/2017 CLINICAL DATA:  Chest pain. EXAM: CHEST  2 VIEW COMPARISON:  04/07/2017 FINDINGS: Heart size and pulmonary vascularity are normal and the lungs are clear. No effusions. Moderate thoracic scoliosis. IMPRESSION: No acute abnormality.  Chronic thoracic scoliosis. Electronically Signed   By: Francene Boyers M.D.   On: 08/06/2017  10:44    Procedures Procedures (including critical care time)  Medications Ordered in ED Medications - No data to display   Initial Impression / Assessment and Plan / ED Course  I have reviewed the triage vital signs and the nursing notes.  Pertinent labs & imaging results that were available during my care of the patient were reviewed by me and considered in my medical decision making (see chart for details).     26yo female with history of asthma presents with concern for 2 days of left leg pain, sharp, centered on lateral aspect of thigh with radiation to feet/toes and sharp chest pain.  Regarding leg pain, she has normal pulses, doubt acute arterial occlusion. No sign of septic arthritis or cellulitis.  No back pain red flags. No risk factors for DVT or signs of leg  swelling and doubt DVT.  Pain sounds radicular with radiation through lateral leg down to back of leg.  Will give rx for methocarbamol, recommend ibuprofen/tylenol.  Regarding chest pain. EKG without significant changes from prior. XR without signs of abnormalities. Normal bilateral upper and lower ext pulses, normal XR, doubt dissection by hx.  Troponin negative, doubt ACS, low risks HEAR score.  Pt PERC negative and low risk wells and doubt PE.  She has chest wall tenderness with recent URI and suspect costochondritis or musculoskeletal pain.  Recommend PCP follow up. Patient discharged in stable condition with understanding of reasons to return.   Final Clinical Impressions(s) / ED Diagnoses   Final diagnoses:  Chest pain, unspecified type  Radicular pain of left lower extremity    ED Discharge Orders        Ordered    methocarbamol (ROBAXIN) 500 MG tablet  Every 6 hours PRN     08/06/17 1309       Alvira MondaySchlossman, Maggi Hershkowitz, MD 08/06/17 2212

## 2017-08-06 NOTE — ED Triage Notes (Signed)
Thursday pt. Reports having lt. Side thigh pain radiates down the leg and is tender to touch.  Pt. Was able to ambulated with the pain.  Last night pt. Developed lt. Arm pain into her lt. Chest wall spasms into her lt. Chest wall../  Pt. Denies any sob, n/v/d.  Pt. Denies any sweating.  Pt. Reports having a dry cough last night with sneezing denies any chills or fever.   ECG and labs completed in triage.   Pt. Is alert and oriented X4.

## 2017-08-06 NOTE — Discharge Instructions (Signed)
Take Tylenol 1000 mg 4 times a day for 1 week. This is the maximum dose of Tylenol usually take from all sources. Please check other over-the-counter medications and prescriptions to ensure you are not taking other medications that contain acetaminophen.  You may also take ibuprofen 400 mg 6 times a day alternating with or at the same time as tylenol.   

## 2018-02-07 ENCOUNTER — Encounter (HOSPITAL_COMMUNITY): Payer: Self-pay | Admitting: Emergency Medicine

## 2018-02-07 ENCOUNTER — Ambulatory Visit (HOSPITAL_COMMUNITY)
Admission: EM | Admit: 2018-02-07 | Discharge: 2018-02-07 | Disposition: A | Discharge status: Home / Self Care | Payer: BC Managed Care – PPO | Attending: Family Medicine | Admitting: Family Medicine

## 2018-02-07 DIAGNOSIS — S40861A Insect bite (nonvenomous) of right upper arm, initial encounter: Secondary | ICD-10-CM | POA: Diagnosis not present

## 2018-02-07 DIAGNOSIS — R21 Rash and other nonspecific skin eruption: Secondary | ICD-10-CM

## 2018-02-07 DIAGNOSIS — W57XXXA Bitten or stung by nonvenomous insect and other nonvenomous arthropods, initial encounter: Secondary | ICD-10-CM | POA: Diagnosis not present

## 2018-02-07 MED ORDER — METHYLPREDNISOLONE ACETATE 40 MG/ML IJ SUSP
40.0000 mg | Freq: Once | INTRAMUSCULAR | Status: AC
  Administered 2018-02-07: 40 mg via INTRAMUSCULAR

## 2018-02-07 MED ORDER — METHYLPREDNISOLONE ACETATE 40 MG/ML IJ SUSP
INTRAMUSCULAR | Status: AC
  Filled 2018-02-07: qty 1

## 2018-02-07 NOTE — ED Triage Notes (Signed)
Pt noticed some bites on her R arm, unsure of what bit her.

## 2018-02-07 NOTE — ED Provider Notes (Signed)
[facility]    CSN: [id number] Arrival date & time: 02/07/18  1643     History   Chief Complaint Chief Complaint  Patient presents with  . Insect Bite    HPI Wendy Morton is a 27 y.o. female.   Patient has 3 bites on her right arm one on the dorsum of her right hand and 2 other ones in the upper arm area.  There is erythema as well as swelling.  There is itching but no pain.  She denies any respiratory symptoms or any other systemic symptom.  HPI  Past Medical History:  Diagnosis Date  . Asthma   . Strep throat     Patient Active Problem List   Diagnosis Date Noted  . Extrinsic asthma 10/10/2015    History reviewed. No pertinent surgical history.  OB History   None      Home Medications    Prior to Admission medications   Medication Sig Start Date End Date Taking? Authorizing Provider  albuterol (PROVENTIL HFA;VENTOLIN HFA) 108 (90 BASE) MCG/ACT inhaler Inhale 1-2 puffs into the lungs every 6 (six) hours as needed for wheezing or shortness of breath. Reported on 10/03/2015    [provider]  azithromycin (ZITHROMAX) 250 MG tablet Take 1 tablet (250 mg total) by mouth daily. Patient not taking: Reported on 02/07/2018 04/07/17   Charlestine NightLawyer, Christopher, PA-C  EPINEPHrine (EPIPEN 2-PAK) 0.3 mg/0.3 mL IJ SOAJ injection Inject 0.3 mLs (0.3 mg total) into the muscle once as needed (for severe allergic reaction). CAll 911 immediately if you have to use this medicine 08/13/14   Muthersbaugh, Dahlia ClientHannah, PA-C  methocarbamol (ROBAXIN) 500 MG tablet Take 2 tablets (1,000 mg total) by mouth every 6 (six) hours as needed for muscle spasms. Patient not taking: Reported on 02/07/2018 08/06/17   Alvira MondaySchlossman, Erin, MD  metroNIDAZOLE (FLAGYL) 500 MG tablet Take 1 tablet (500 mg total) by mouth 2 (two) times daily. Do not consume alcohol with this medication. Patient not taking: Reported on 02/07/2018 02/02/16   Ozella RocksMerrell, David J, MD  predniSONE (DELTASONE) 50 MG tablet Take 1 tablet  (50 mg total) by mouth daily. Patient not taking: Reported on 02/07/2018 04/07/17   Charlestine NightLawyer, Christopher, PA-C  promethazine-dextromethorphan (PROMETHAZINE-DM) 6.25-15 MG/5ML syrup Take 5 mLs by mouth 4 (four) times daily as needed for cough. Patient not taking: Reported on 02/07/2018 04/07/17   Charlestine NightLawyer, Christopher, PA-C    Family History Family History  Problem Relation Age of Onset  . Cancer Mother     Social History Social History   Tobacco Use  . Smoking status: Current Some Day Smoker  . Smokeless tobacco: Never Used  Substance Use Topics  . Alcohol use: Yes    Alcohol/week: 0.0 oz    Comment: occasional per pt  . Drug use: No     Allergies   Tomato   Review of Systems Review of Systems  Constitutional: Negative for chills and fever.  HENT: Negative for ear pain and sore throat.   Eyes: Negative for pain and visual disturbance.  Respiratory: Negative for cough and shortness of breath.   Cardiovascular: Negative for chest pain and palpitations.  Gastrointestinal: Negative for abdominal pain and vomiting.  Genitourinary: Negative for dysuria and hematuria.  Musculoskeletal: Negative for arthralgias and back pain.  Skin: Positive for rash. Negative for color change.  Neurological: Negative for seizures and syncope.  All other systems reviewed and are negative.    Physical Exam Triage Vital Signs ED Triage Vitals [02/07/18  1759]  Enc Vitals Group     BP 110/79     Pulse Rate 70     Resp 16     Temp 99.1 F (37.3 C)     Temp src      SpO2 100 %     Weight      Height      Head Circumference      Peak Flow      Pain Score      Pain Loc      Pain Edu?      Excl. in GC?    No data found.  Updated Vital Signs BP 110/79   Pulse 70   Temp 99.1 F (37.3 C)   Resp 16   SpO2 100%   Visual Acuity Right Eye Distance:   Left Eye Distance:   Bilateral Distance:    Right Eye Near:   Left Eye Near:    Bilateral Near:     Physical Exam  Constitutional:  She appears well-nourished.  Cardiovascular: Normal rate and regular rhythm.  Pulmonary/Chest: Effort normal and breath sounds normal.  Skin: Skin is warm. Rash noted.            3 distinct areas of erythema with swelling under the skin, almost like urticaria.   UC Treatments / Results  Labs (all labs ordered are listed, but only abnormal results are displayed) Labs Reviewed - No data to display  EKG None  Radiology No results found.  Procedures Procedures (including critical care time)  Medications Ordered in UC Medications - No data to display  Initial Impression / Assessment and Plan / UC Course  I have reviewed the triage vital signs and the nursing notes.  Pertinent labs & imaging results that were available during my care of the patient were reviewed by me and considered in my medical decision making (see chart for details).     Insect bite with local reaction will treat with topical as well as injectable steroid and antihistamine.  Also recommended local application of ice pack for swelling Final Clinical Impressions(s) / UC Diagnoses   Final diagnoses:  None   Discharge Instructions   None    ED Prescriptions    None     Controlled Substance Prescriptions [place] Controlled Substance Registry consulted? No   Frederica Kuster, MD 02/07/18 [phone]

## 2018-02-07 NOTE — Discharge Instructions (Addendum)
Take Zyrtec or Allegra twice daily for 3 days

## 2018-03-10 ENCOUNTER — Encounter (HOSPITAL_COMMUNITY): Payer: Self-pay | Admitting: Emergency Medicine

## 2018-03-10 ENCOUNTER — Other Ambulatory Visit: Payer: Self-pay

## 2018-03-10 ENCOUNTER — Ambulatory Visit (HOSPITAL_COMMUNITY)
Admission: EM | Admit: 2018-03-10 | Discharge: 2018-03-10 | Disposition: A | Discharge status: Home / Self Care | Payer: BC Managed Care – PPO | Attending: Internal Medicine | Admitting: Internal Medicine

## 2018-03-10 DIAGNOSIS — N390 Urinary tract infection, site not specified: Secondary | ICD-10-CM

## 2018-03-10 DIAGNOSIS — R350 Frequency of micturition: Secondary | ICD-10-CM

## 2018-03-10 LAB — POCT URINALYSIS DIP (DEVICE)
BILIRUBIN URINE: NEGATIVE
GLUCOSE, UA: NEGATIVE mg/dL
HGB URINE DIPSTICK: NEGATIVE
KETONES UR: NEGATIVE mg/dL
NITRITE: NEGATIVE
PROTEIN: NEGATIVE mg/dL
SPECIFIC GRAVITY, URINE: 1.01 (ref 1.005–1.030)
Urobilinogen, UA: 0.2 mg/dL (ref 0.0–1.0)
pH: 6.5 (ref 5.0–8.0)

## 2018-03-10 MED ORDER — FLUCONAZOLE 150 MG PO TABS: 150.0000 mg | ORAL_TABLET | Freq: Once | ORAL | 0 refills | Status: AC

## 2018-03-10 MED ORDER — CEPHALEXIN 500 MG PO CAPS: 500.0000 mg | ORAL_CAPSULE | Freq: Three times a day (TID) | ORAL | 0 refills | Status: AC

## 2018-03-10 NOTE — Discharge Instructions (Addendum)
Start the abx tonight. I have printed to make sure you can get them tonight.

## 2018-03-10 NOTE — ED Provider Notes (Signed)
03/10/2018 8:21 PM   DOB: 1991-05-05 / MRN: [id number]  SUBJECTIVE:  Wendy Morton is a 27 y.o. female presenting for dysuria and frequency that started a few days ago.  Denies fever.  Has tried Macrobid.  STI ruled out on annual exam a few weeks ago and has not had sex since.   She is allergic to tomato.   She  has a past medical history of Asthma and Strep throat.    She  reports that she has been smoking. She has never used smokeless tobacco. She reports that she drinks alcohol. She reports that she does not use drugs. She  reports that she currently engages in sexual activity. She reports using the following method of birth control/protection: None. The patient  has no past surgical history on file.  Her family history includes Cancer in her mother.  ROS Per HPI  OBJECTIVE:  BP 100/73 (BP Location: Left Arm)   Pulse 88   Temp 98.3 F (36.8 C) (Oral)   Resp 18   LMP 03/05/2018   SpO2 100%   Wt Readings from Last 3 Encounters:  08/06/17 226 lb (102.5 kg)  04/06/17 234 lb (106.1 kg)  11/28/15 265 lb (120.2 kg)   Temp Readings from Last 3 Encounters:  03/10/18 98.3 F (36.8 C) (Oral)  02/07/18 99.1 F (37.3 C)  08/06/17 98 F (36.7 C) (Oral)   BP Readings from Last 3 Encounters:  03/10/18 100/73  02/07/18 110/79  08/06/17 (!) 113/99   Pulse Readings from Last 3 Encounters:  03/10/18 88  02/07/18 70  08/06/17 68    Physical Exam  Constitutional: She is oriented to person, place, and time. She appears well-nourished. No distress.  Eyes: Pupils are equal, round, and reactive to light. EOM are normal.  Cardiovascular: Normal rate.  Pulmonary/Chest: Effort normal.  Abdominal: Soft. She exhibits no distension and no mass. There is no tenderness. There is no rebound and no guarding. No hernia.  Neurological: She is alert and oriented to person, place, and time. No cranial nerve deficit. Gait normal.  Skin: Skin is dry. She is not diaphoretic.  Psychiatric: She has a  normal mood and affect.  Vitals reviewed.   Results for orders placed or performed during the hospital encounter of 03/10/18 (from the past 72 hour(s))  POCT urinalysis dip (device)     Status: Abnormal   Collection Time: 03/10/18  7:49 PM  Result Value Ref Range   Glucose, UA NEGATIVE NEGATIVE mg/dL   Bilirubin Urine NEGATIVE NEGATIVE   Ketones, ur NEGATIVE NEGATIVE mg/dL   Specific Gravity, Urine 1.010 1.005 - 1.030   Hgb urine dipstick NEGATIVE NEGATIVE   pH 6.5 5.0 - 8.0   Protein, ur NEGATIVE NEGATIVE mg/dL   Urobilinogen, UA 0.2 0.0 - 1.0 mg/dL   Nitrite NEGATIVE NEGATIVE   Leukocytes, UA SMALL (A) NEGATIVE    Comment: Biochemical Testing Only. Please order routine urinalysis from main lab if confirmatory testing is needed.    No results found.  ASSESSMENT AND PLAN:   Lower urinary tract infectious disease  Urinary frequency    Discharge Instructions     Start the abx tonight. I have printed to make sure you can get them tonight.        The patient is advised to call or return to clinic if she does not see an improvement in symptoms, or to seek the care of the closest emergency department if she worsens with the above plan.   Casimiro Needle  Clark, MHS, PA-C 03/10/2018 8:21 PM   Ofilia Neas, PA-C 03/10/18 2022

## 2018-03-10 NOTE — ED Triage Notes (Signed)
1-2 weeks ago treated for uti.  Thought she recovered.  Patient noticed frequent urination started 2 days ago..  Patient has pain on right flank area.

## 2018-04-26 ENCOUNTER — Encounter (HOSPITAL_COMMUNITY): Payer: Self-pay | Admitting: Emergency Medicine

## 2018-04-26 ENCOUNTER — Ambulatory Visit (HOSPITAL_COMMUNITY)
Admission: EM | Admit: 2018-04-26 | Discharge: 2018-04-26 | Disposition: A | Discharge status: Home / Self Care | Payer: BC Managed Care – PPO | Attending: Internal Medicine | Admitting: Internal Medicine

## 2018-04-26 ENCOUNTER — Other Ambulatory Visit: Payer: Self-pay

## 2018-04-26 DIAGNOSIS — J209 Acute bronchitis, unspecified: Secondary | ICD-10-CM | POA: Diagnosis not present

## 2018-04-26 DIAGNOSIS — J45909 Unspecified asthma, uncomplicated: Secondary | ICD-10-CM | POA: Insufficient documentation

## 2018-04-26 DIAGNOSIS — F17200 Nicotine dependence, unspecified, uncomplicated: Secondary | ICD-10-CM | POA: Diagnosis not present

## 2018-04-26 DIAGNOSIS — Z79899 Other long term (current) drug therapy: Secondary | ICD-10-CM | POA: Insufficient documentation

## 2018-04-26 DIAGNOSIS — R05 Cough: Secondary | ICD-10-CM | POA: Diagnosis present

## 2018-04-26 LAB — POCT RAPID STREP A: Streptococcus, Group A Screen (Direct): NEGATIVE

## 2018-04-26 MED ORDER — AZITHROMYCIN 250 MG PO TABS: 250.0000 mg | ORAL_TABLET | Freq: Every day | ORAL | 0 refills | Status: AC

## 2018-04-26 MED ORDER — FLUTICASONE PROPIONATE 50 MCG/ACT NA SUSP: 1.0000 | Freq: Every day | NASAL | 0 refills | Status: AC

## 2018-04-26 MED ORDER — ALBUTEROL SULFATE HFA 108 (90 BASE) MCG/ACT IN AERS: 1.0000 | INHALATION_SPRAY | Freq: Four times a day (QID) | RESPIRATORY_TRACT | 0 refills | Status: AC | PRN

## 2018-04-26 MED ORDER — CETIRIZINE HCL 10 MG PO CAPS: 10.0000 mg | ORAL_CAPSULE | Freq: Every day | ORAL | 0 refills | Status: AC

## 2018-04-26 MED ORDER — BENZONATATE 200 MG PO CAPS: 200.0000 mg | ORAL_CAPSULE | Freq: Three times a day (TID) | ORAL | 0 refills | Status: AC | PRN

## 2018-04-26 MED ORDER — PREDNISONE 50 MG PO TABS: 50.0000 mg | ORAL_TABLET | Freq: Every day | ORAL | 0 refills | Status: AC

## 2018-04-26 NOTE — ED Triage Notes (Signed)
The patient presented to the [facility] with a complaint of a cough, chest congestion and a sore throat x 4 days. 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

## 2018-04-26 NOTE — Discharge Instructions (Signed)
Please use albuterol inhaler as needed every 6 hours for shortness of breath, wheezing or chest tightness Please begin prednisone daily with food on your stomach for the next 5 days For nasal congestion please begin daily allergy pill, I sent in the generic version of Zyrtec and Flonase nasal spray or ipratropium spray For cough you may use Tessalon, the prednisone and albuterol should also help with your cough Please use honey and hot tea with lemon to help with hoarseness and cough May fill prescription for azithromycin on Tuesday if not having any improvement with above  Please continue to monitor symptoms and follow-up if developing fever, shortness of breath, difficulty breathing, worsening symptoms, not improving with above

## 2018-04-26 NOTE — ED Provider Notes (Signed)
[facility]    CSN: [id number] Arrival date & time: 04/26/18  1007     History   Chief Complaint Chief Complaint  Patient presents with  . Cough    HPI Wendy Morton is a 27 y.o. female history of asthma; Patient is presenting with URI symptoms- congestion, cough, sore throat.  Patient has also noticed some wheezing and hoarseness in her voice as well as body aches.  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  Symptoms have been going on for 7 days, progressively worsening. Patient has tried Alka-Seltzer, Mucinex, TheraFlu, with minimal relief. Denies fever, nausea, vomiting, diarrhea. Denies shortness of breath and chest pain.    HPI  Past Medical History:  Diagnosis Date  . Asthma   . Strep throat     Patient Active Problem List   Diagnosis Date Noted  . Extrinsic asthma 10/10/2015    History reviewed. No pertinent surgical history.  OB History   None      Home Medications    Prior to Admission medications   Medication Sig Start Date End Date Taking? Authorizing Provider  albuterol (PROVENTIL HFA;VENTOLIN HFA) 108 (90 Base) MCG/ACT inhaler Inhale 1-2 puffs into the lungs every 6 (six) hours as needed for wheezing or shortness of breath. 04/26/18   Arieanna Pressey C, PA-C  azithromycin (ZITHROMAX) 250 MG tablet Take 1 tablet (250 mg total) by mouth daily. Take first 2 tablets together, then 1 every day until finished. 04/26/18   Carmen Vallecillo C, PA-C  benzonatate (TESSALON) 200 MG capsule Take 1 capsule (200 mg total) by mouth 3 (three) times daily as needed for up to 7 days for cough. 04/26/18 05/03/18  Swannie Milius C, PA-C  Cetirizine HCl 10 MG CAPS Take 1 capsule (10 mg total) by mouth daily for 10 days. 04/26/18 05/06/18  Chantea Surace C, PA-C  EPINEPHrine (EPIPEN 2-PAK) 0.3 mg/0.3 mL IJ SOAJ injection Inject 0.3 mLs (0.3 mg total) into the muscle once as needed (for severe allergic reaction). CAll 911  immediately if you have to use this medicine 08/13/14   Muthersbaugh, Dahlia Client, PA-C  fluticasone (FLONASE) 50 MCG/ACT nasal spray Place 1-2 sprays into both nostrils daily for 7 days. 04/26/18 05/03/18  Swara Donze C, PA-C  predniSONE (DELTASONE) 50 MG tablet Take 1 tablet (50 mg total) by mouth daily for 5 days. 04/26/18 05/01/18  Patton Swisher, Junius Creamer, PA-C    Family History Family History  Problem Relation Age of Onset  . Cancer Mother     Social History Social History   Tobacco Use  . Smoking status: Current Some Day Smoker  . Smokeless tobacco: Never Used  Substance Use Topics  . Alcohol use: Yes    Alcohol/week: 0.0 standard drinks    Comment: occasional per pt  . Drug use: No     Allergies   Tomato   Review of Systems Review of Systems  Constitutional: Negative for activity change, appetite change, chills, fatigue and fever.  HENT: Positive for congestion, rhinorrhea and sore throat. Negative for ear pain and trouble swallowing.   Eyes: Negative for discharge and redness.  Respiratory: Positive for cough and wheezing. Negative for chest tightness and shortness of breath.   Cardiovascular: Negative for chest pain.  Gastrointestinal: Negative for abdominal pain, diarrhea, nausea and vomiting.  Musculoskeletal: Negative for myalgias.  Skin: Negative for rash.  Neurological: Negative for dizziness, light-headedness and headaches.     Physical Exam Triage Vital Signs  ED Triage Vitals  Enc Vitals Group     BP 04/26/18 1035 99/66     Pulse Rate 04/26/18 1035 82     Resp 04/26/18 1035 18     Temp 04/26/18 1035 98.3 F (36.8 C)     Temp Source 04/26/18 1035 Oral     SpO2 04/26/18 1035 100 %     Weight --      Height --      Head Circumference --      Peak Flow --      Pain Score 04/26/18 1034 7     Pain Loc --      Pain Edu? --      Excl. in GC? --    No data found.  Updated Vital Signs BP 99/66 (BP Location: Right Arm)   Pulse 82   Temp 98.3 F (36.8  C) (Oral)   Resp 18   SpO2 100%   Visual Acuity Right Eye Distance:   Left Eye Distance:   Bilateral Distance:    Right Eye Near:   Left Eye Near:    Bilateral Near:     Physical Exam  Constitutional: She appears well-developed and well-nourished. No distress.  HENT:  Head: Normocephalic and atraumatic.  Bilateral ears without tenderness to palpation of external auricle, tragus and mastoid, EAC's without erythema or swelling, TM's with good bony landmarks and cone of light. Non erythematous.  Oral mucosa pink and moist, no tonsillar enlargement or exudate. Posterior pharynx patent and nonerythematous, no uvula deviation or swelling.  Hoarse voice  Eyes: Conjunctivae are normal.  Neck: Neck supple.  Cardiovascular: Normal rate and regular rhythm.  No murmur heard. Pulmonary/Chest: Effort normal. No respiratory distress. She has wheezes.  Bilateral end expiratory faint wheezing throughout bilateral lung fields; left side but slightly more prominent than right No rales or other adventitious sounds auscultated  Abdominal: Soft. There is no tenderness.  Musculoskeletal: She exhibits no edema.  Neurological: She is alert.  Skin: Skin is warm and dry.  Psychiatric: She has a normal mood and affect.  Nursing note and vitals reviewed.    UC Treatments / Results  Labs (all labs ordered are listed, but only abnormal results are displayed) Labs Reviewed  CULTURE, GROUP A STREP [facility])  POCT RAPID STREP A    EKG None  Radiology No results found.  Procedures Procedures (including critical care time)  Medications Ordered in UC Medications - No data to display  Initial Impression / Assessment and Plan / UC Course  I have reviewed the triage vital signs and the nursing notes.  Pertinent labs & imaging results that were available during my care of the patient were reviewed by me and considered in my medical decision making (see chart for details).     Patient with  bronchitis versus viral URI with asthma exacerbation.  Will treat patient with albuterol inhaler and prednisone for wheezing and inflammation in chest, Zyrtec and Flonase for nasal congestion, strep test negative.  Provide patient with printed prescription for azithromycin to fill in 3 to 4 days if treatment with symptomatic recommendations over the next 3 to 4 days not improving symptoms.  Continue to monitor symptoms, breathing and temperature,Discussed strict return precautions. Patient verbalized understanding and is agreeable with plan.  Final Clinical Impressions(s) / UC Diagnoses   Final diagnoses:  Acute bronchitis, unspecified organism     Discharge Instructions     Please use albuterol inhaler as needed every 6 hours for shortness of  breath, wheezing or chest tightness Please begin prednisone daily with food on your stomach for the next 5 days For nasal congestion please begin daily allergy pill, I sent in the generic version of Zyrtec and Flonase nasal spray or ipratropium spray For cough you may use Tessalon, the prednisone and albuterol should also help with your cough Please use honey and hot tea with lemon to help with hoarseness and cough May fill prescription for azithromycin on Tuesday if not having any improvement with above  Please continue to monitor symptoms and follow-up if developing fever, shortness of breath, difficulty breathing, worsening symptoms, not improving with above    ED Prescriptions    Medication Sig Dispense Auth. Provider   albuterol (PROVENTIL HFA;VENTOLIN HFA) 108 (90 Base) MCG/ACT inhaler Inhale 1-2 puffs into the lungs every 6 (six) hours as needed for wheezing or shortness of breath. 1 Inhaler Nadalyn Deringer C, PA-C   predniSONE (DELTASONE) 50 MG tablet Take 1 tablet (50 mg total) by mouth daily for 5 days. 5 tablet Lanis Storlie C, PA-C   Cetirizine HCl 10 MG CAPS Take 1 capsule (10 mg total) by mouth daily for 10 days. 10 capsule Shiquita Collignon,  Nakiea Metzner C, PA-C   fluticasone (FLONASE) 50 MCG/ACT nasal spray Place 1-2 sprays into both nostrils daily for 7 days. 1 g Jaxden Blyden C, PA-C   azithromycin (ZITHROMAX) 250 MG tablet Take 1 tablet (250 mg total) by mouth daily. Take first 2 tablets together, then 1 every day until finished. 6 tablet Shawnelle Spoerl C, PA-C   benzonatate (TESSALON) 200 MG capsule Take 1 capsule (200 mg total) by mouth 3 (three) times daily as needed for up to 7 days for cough. 28 capsule Jazmin Vensel C, PA-C     Controlled Substance Prescriptions [place] Controlled Substance Registry consulted? Not Applicable   Lew Dawes, [place] 04/26/18 1125

## 2018-04-28 LAB — CULTURE, GROUP A STREP (THRC)

## 2018-06-04 ENCOUNTER — Encounter (HOSPITAL_COMMUNITY): Payer: Self-pay | Admitting: Family Medicine

## 2018-06-04 ENCOUNTER — Emergency Department (HOSPITAL_COMMUNITY)
Admission: EM | Admit: 2018-06-04 | Discharge: 2018-06-04 | Disposition: A | Discharge status: Home / Self Care | Payer: BC Managed Care – PPO | Attending: Emergency Medicine | Admitting: Emergency Medicine

## 2018-06-04 ENCOUNTER — Emergency Department (HOSPITAL_COMMUNITY): Payer: BC Managed Care – PPO

## 2018-06-04 DIAGNOSIS — Z87891 Personal history of nicotine dependence: Secondary | ICD-10-CM | POA: Diagnosis not present

## 2018-06-04 DIAGNOSIS — X58XXXA Exposure to other specified factors, initial encounter: Secondary | ICD-10-CM | POA: Insufficient documentation

## 2018-06-04 DIAGNOSIS — S46811A Strain of other muscles, fascia and tendons at shoulder and upper arm level, right arm, initial encounter: Secondary | ICD-10-CM | POA: Diagnosis not present

## 2018-06-04 DIAGNOSIS — Y939 Activity, unspecified: Secondary | ICD-10-CM | POA: Insufficient documentation

## 2018-06-04 DIAGNOSIS — Y999 Unspecified external cause status: Secondary | ICD-10-CM | POA: Insufficient documentation

## 2018-06-04 DIAGNOSIS — Y929 Unspecified place or not applicable: Secondary | ICD-10-CM | POA: Diagnosis not present

## 2018-06-04 DIAGNOSIS — M546 Pain in thoracic spine: Secondary | ICD-10-CM | POA: Diagnosis present

## 2018-06-04 MED ORDER — KETOROLAC TROMETHAMINE 60 MG/2ML IM SOLN
60.0000 mg | Freq: Once | INTRAMUSCULAR | Status: AC
  Administered 2018-06-04: 60 mg via INTRAMUSCULAR
  Filled 2018-06-04: qty 2

## 2018-06-04 MED ORDER — CYCLOBENZAPRINE HCL 10 MG PO TABS: 10.0000 mg | ORAL_TABLET | Freq: Three times a day (TID) | ORAL | 0 refills | Status: DC | PRN

## 2018-06-04 MED ORDER — TRAMADOL HCL 50 MG PO TABS
50.0000 mg | ORAL_TABLET | Freq: Once | ORAL | Status: AC
  Administered 2018-06-04: 50 mg via ORAL
  Filled 2018-06-04: qty 1

## 2018-06-04 MED ORDER — IBUPROFEN 800 MG PO TABS: 800.0000 mg | ORAL_TABLET | Freq: Four times a day (QID) | ORAL | 0 refills | Status: AC | PRN

## 2018-06-04 MED ORDER — TRAMADOL HCL 50 MG PO TABS: 50.0000 mg | ORAL_TABLET | Freq: Four times a day (QID) | ORAL | 0 refills | Status: AC | PRN

## 2018-06-04 MED ORDER — OXYCODONE-ACETAMINOPHEN 5-325 MG PO TABS
1.0000 | ORAL_TABLET | Freq: Once | ORAL | Status: AC
  Administered 2018-06-04: 1 via ORAL
  Filled 2018-06-04: qty 1

## 2018-06-04 NOTE — Discharge Instructions (Addendum)
Return here as needed. Follow up with your doctor. Ice and heat to the area that is sore.

## 2018-06-04 NOTE — ED Triage Notes (Signed)
Patient is complaining of neck pain that extends down her back. She reports pain started about 20 minutes ago. Patient is ambulatory with a steady gait. No loss of bowel or bladder and numbness and tingling.

## 2018-06-04 NOTE — ED Provider Notes (Signed)
[facility]   CSN: [id number] Arrival date & time: 06/04/18  0608     History   Chief Complaint Chief Complaint  Patient presents with  . Back Pain    HPI Wendy Morton is a 27 y.o. female.  HPI Patient presents to the emergency department with neck and upper back pain that started 20 minutes prior to arrival.  The patient states she did not take any medications prior to arrival for her symptoms.  Patient states that movements and palpation make the pain worse.  Patient states that she was getting up to go to the bathroom when she noticed she was having neck and upper back pain.  Patient states that she does not have any tingling or numbness in her upper extremities.  Patient states she has had no recent illnesses or fevers.  Patient denies any drug use intravenously.  The patient denies chest pain, shortness of breath, headache,blurred vision,  fever, cough, weakness, numbness, dizziness, anorexia, edema, abdominal pain, nausea, vomiting, diarrhea, rash, back pain, dysuria, hematemesis, bloody stool, near syncope, or syncope. Past Medical History:  Diagnosis Date  . Asthma   . Strep throat     Patient Active Problem List   Diagnosis Date Noted  . Extrinsic asthma 10/10/2015    History reviewed. No pertinent surgical history.   OB History   None      Home Medications    Prior to Admission medications   Medication Sig Start Date End Date Taking? Authorizing Provider  albuterol (PROVENTIL HFA;VENTOLIN HFA) 108 (90 Base) MCG/ACT inhaler Inhale 1-2 puffs into the lungs every 6 (six) hours as needed for wheezing or shortness of breath. 04/26/18   Wieters, Hallie C, PA-C  azithromycin (ZITHROMAX) 250 MG tablet Take 1 tablet (250 mg total) by mouth daily. Take first 2 tablets together, then 1 every day until finished. 04/26/18   Wieters, Hallie C, PA-C  Cetirizine HCl 10 MG CAPS Take 1 capsule (10 mg total) by mouth daily for 10  days. 04/26/18 05/06/18  Wieters, Hallie C, PA-C  EPINEPHrine (EPIPEN 2-PAK) 0.3 mg/0.3 mL IJ SOAJ injection Inject 0.3 mLs (0.3 mg total) into the muscle once as needed (for severe allergic reaction). CAll 911 immediately if you have to use this medicine 08/13/14   Muthersbaugh, Dahlia ClientHannah, PA-C  fluticasone (FLONASE) 50 MCG/ACT nasal spray Place 1-2 sprays into both nostrils daily for 7 days. 04/26/18 05/03/18  Wieters, Junius CreamerHallie C, PA-C    Family History Family History  Problem Relation Age of Onset  . Cancer Mother     Social History Social History   Tobacco Use  . Smoking status: Former Games developermoker  . Smokeless tobacco: Never Used  Substance Use Topics  . Alcohol use: Yes    Alcohol/week: 0.0 standard drinks    Comment: 3 times a month   . Drug use: No     Allergies   Tomato   Review of Systems Review of Systems All other systems negative except as documented in the HPI. All pertinent positives and negatives as reviewed in the HPI.  Physical Exam Updated Vital Signs BP 115/82 (BP Location: Left Arm)   Pulse 78   Temp 98 F (36.7 C) (Oral)   Resp 18   Ht 5\' 6"  (1.676 m)   Wt 97.5 kg   LMP 05/26/2018   SpO2 100%   BMI 34.70 kg/m   Physical Exam  Constitutional: She is oriented to person, place, and time. She appears well-developed  and well-nourished. No distress.  HENT:  Head: Normocephalic and atraumatic.  Mouth/Throat: Oropharynx is clear and moist.  Eyes: Pupils are equal, round, and reactive to light.  Neck: Normal range of motion. Neck supple.  Cardiovascular: Normal rate, regular rhythm and normal heart sounds. Exam reveals no gallop and no friction rub.  No murmur heard. Pulmonary/Chest: Effort normal and breath sounds normal. No respiratory distress. She has no wheezes.  Musculoskeletal:       Back:  Neurological: She is alert and oriented to person, place, and time. She displays normal reflexes. No sensory deficit. She exhibits normal muscle tone.  Coordination normal.  Skin: Skin is warm and dry. Capillary refill takes less than 2 seconds. No rash noted. No erythema.  Psychiatric: She has a normal mood and affect. Her behavior is normal.  Nursing note and vitals reviewed.    ED Treatments / Results  Labs (all labs ordered are listed, but only abnormal results are displayed) Labs Reviewed - No data to display  EKG None  Radiology Dg Chest 2 View  Result Date: 06/04/2018 CLINICAL DATA:  Back and chest pain EXAM: CHEST - 2 VIEW COMPARISON:  08/06/2017 FINDINGS: Cardiac shadow is within normal limits. The lungs are well aerated bilaterally. No focal infiltrate or sizable effusion is seen. No bony abnormality is noted. IMPRESSION: No active cardiopulmonary disease. Electronically Signed   By: Alcide Clever M.D.   On: 06/04/2018 07:29    Procedures Procedures (including critical care time)  Medications Ordered in ED Medications  ketorolac (TORADOL) injection 60 mg (60 mg Intramuscular Given 06/04/18 0644)  traMADol (ULTRAM) tablet 50 mg (50 mg Oral Given 06/04/18 1610)     Initial Impression / Assessment and Plan / ED Course  I have reviewed the triage vital signs and the nursing notes.  Pertinent labs & imaging results that were available during my care of the patient were reviewed by me and considered in my medical decision making (see chart for details).     Patient most likely has a trapezius muscle strain based on her physical exam findings.  The patient's x-rays were negative.  The patient does not have any neurological deficits noted on exam.  Patient will be advised to return here as needed.  Told to use ice and heat on the area that is sore.  Final Clinical Impressions(s) / ED Diagnoses   Final diagnoses:  None    ED Discharge Orders    None       Charlestine Night, PA-C 06/06/18 9604    Bethann Berkshire, MD 06/07/18 [id number]

## 2018-06-04 NOTE — ED Notes (Signed)
Pt verbalizes understanding of abstaining from driving when taking narcotic pain medication.

## 2018-06-17 ENCOUNTER — Other Ambulatory Visit: Payer: Self-pay | Admitting: Family

## 2018-06-17 DIAGNOSIS — Z1231 Encounter for screening mammogram for malignant neoplasm of breast: Secondary | ICD-10-CM

## 2018-07-25 ENCOUNTER — Ambulatory Visit: Payer: BC Managed Care – PPO

## 2019-04-22 ENCOUNTER — Emergency Department (HOSPITAL_BASED_OUTPATIENT_CLINIC_OR_DEPARTMENT_OTHER)
Admission: EM | Admit: 2019-04-22 | Discharge: 2019-04-22 | Disposition: A | Discharge status: Home / Self Care | Payer: BC Managed Care – PPO | Attending: Emergency Medicine | Admitting: Emergency Medicine

## 2019-04-22 ENCOUNTER — Other Ambulatory Visit: Payer: Self-pay

## 2019-04-22 ENCOUNTER — Encounter (HOSPITAL_BASED_OUTPATIENT_CLINIC_OR_DEPARTMENT_OTHER): Payer: Self-pay | Admitting: *Deleted

## 2019-04-22 DIAGNOSIS — M7918 Myalgia, other site: Secondary | ICD-10-CM | POA: Diagnosis present

## 2019-04-22 DIAGNOSIS — F17200 Nicotine dependence, unspecified, uncomplicated: Secondary | ICD-10-CM | POA: Diagnosis not present

## 2019-04-22 DIAGNOSIS — Z79899 Other long term (current) drug therapy: Secondary | ICD-10-CM | POA: Insufficient documentation

## 2019-04-22 DIAGNOSIS — J45909 Unspecified asthma, uncomplicated: Secondary | ICD-10-CM | POA: Insufficient documentation

## 2019-04-22 DIAGNOSIS — Z20828 Contact with and (suspected) exposure to other viral communicable diseases: Secondary | ICD-10-CM | POA: Diagnosis not present

## 2019-04-22 NOTE — Discharge Instructions (Signed)
Person Under Monitoring Name: Wendy Morton  Location: [place] [address]   Infection Prevention Recommendations for Individuals Confirmed to have, or Being Evaluated for, 2019 Novel Coronavirus (COVID-19) Infection Who Receive Care at Home  Individuals who are confirmed to have, or are being evaluated for, COVID-19 should follow the prevention steps below until a healthcare provider or local or state health department says they can return to normal activities.  Stay home except to get medical care You should restrict activities outside your home, except for getting medical care. Do not go to work, school, or public areas, and do not use public transportation or taxis.  Call ahead before visiting your doctor Before your medical appointment, call the healthcare provider and tell them that you have, or are being evaluated for, COVID-19 infection. This will help the healthcare providers office take steps to keep other people from getting infected. Ask your healthcare provider to call the local or state health department.  Monitor your symptoms Seek prompt medical attention if your illness is worsening (e.g., difficulty breathing). Before going to your medical appointment, call the healthcare provider and tell them that you have, or are being evaluated for, COVID-19 infection. Ask your healthcare provider to call the local or state health department.  Wear a facemask You should wear a facemask that covers your nose and mouth when you are in the same room with other people and when you visit a healthcare provider. People who live with or visit you should also wear a facemask while they are in the same room with you.  Separate yourself from other people in your home As much as possible, you should stay in a different room from other people in your home. Also, you should use a separate bathroom, if available.  Avoid sharing household items You should not share  dishes, drinking glasses, cups, eating utensils, towels, bedding, or other items with other people in your home. After using these items, you should wash them thoroughly with soap and water.  Cover your coughs and sneezes Cover your mouth and nose with a tissue when you cough or sneeze, or you can cough or sneeze into your sleeve. Throw used tissues in a lined trash can, and immediately wash your hands with soap and water for at least 20 seconds or use an alcohol-based hand rub.  Wash your [facility] your hands often and thoroughly with soap and water for at least 20 seconds. You can use an alcohol-based hand sanitizer if soap and water are not available and if your hands are not visibly dirty. Avoid touching your eyes, nose, and mouth with unwashed hands.   Prevention Steps for Caregivers and Household Members of Individuals Confirmed to have, or Being Evaluated for, COVID-19 Infection Being Cared for in the Home  If you live with, or provide care at home for, a person confirmed to have, or being evaluated for, COVID-19 infection please follow these guidelines to prevent infection:  Follow healthcare providers instructions Make sure that you understand and can help the patient follow any healthcare provider instructions for all care.  Provide for the patients basic needs You should help the patient with basic needs in the home and provide support for getting groceries, prescriptions, and other personal needs.  Monitor the patients symptoms If they are getting sicker, call his or her medical provider and tell them that the patient has, or is being evaluated for, COVID-19 infection. This will help the healthcare providers office  take steps to keep other people from getting infected. °Ask the healthcare provider to call the local or state health department. ° °Limit the number of people who have contact with the patient °If possible, have only one caregiver for the patient. °Other  household members should stay in another home or place of residence. If this is not possible, they should stay °in another room, or be separated from the patient as much as possible. Use a separate bathroom, if available. °Restrict visitors who do not have an essential need to be in the home. ° °Keep older adults, very young children, and other sick people away from the patient °Keep older adults, very young children, and those who have compromised immune systems or chronic health conditions away from the patient. This includes people with chronic heart, lung, or kidney conditions, diabetes, and cancer. ° °Ensure good ventilation °Make sure that shared spaces in the home have good air flow, such as from an air conditioner or an opened window, °weather permitting. ° °Wash your hands often °Wash your hands often and thoroughly with soap and water for at least 20 seconds. You can use an alcohol based hand sanitizer if soap and water are not available and if your hands are not visibly dirty. °Avoid touching your eyes, nose, and mouth with unwashed hands. °Use disposable paper towels to dry your hands. If not available, use dedicated cloth towels and replace them when they become wet. ° °Wear a facemask and gloves °Wear a disposable facemask at all times in the room and gloves when you touch or have contact with the patient’s blood, body fluids, and/or secretions or excretions, such as sweat, saliva, sputum, nasal mucus, vomit, urine, or feces.  Ensure the mask fits over your nose and mouth tightly, and do not touch it during use. °Throw out disposable facemasks and gloves after using them. Do not reuse. °Wash your hands immediately after removing your facemask and gloves. °If your personal clothing becomes contaminated, carefully remove clothing and launder. Wash your hands after handling contaminated clothing. °Place all used disposable facemasks, gloves, and other waste in a lined container before disposing them with  other household waste. °Remove gloves and wash your hands immediately after handling these items. ° °Do not share dishes, glasses, or other household items with the patient °Avoid sharing household items. You should not share dishes, drinking glasses, cups, eating utensils, towels, bedding, or other items with a patient who is confirmed to have, or being evaluated for, COVID-19 infection. °After the person uses these items, you should wash them thoroughly with soap and water. ° °Wash laundry thoroughly °Immediately remove and wash clothes or bedding that have blood, body fluids, and/or secretions or excretions, such as sweat, saliva, sputum, nasal mucus, vomit, urine, or feces, on them. °Wear gloves when handling laundry from the patient. °Read and follow directions on labels of laundry or clothing items and detergent. In general, wash and dry with the warmest temperatures recommended on the label. ° °Clean all areas the individual has used often °Clean all touchable surfaces, such as counters, tabletops, doorknobs, bathroom fixtures, toilets, phones, keyboards, tablets, and bedside tables, every day. Also, clean any surfaces that may have blood, body fluids, and/or secretions or excretions on them. °Wear gloves when cleaning surfaces the patient has come in contact with. °Use a diluted bleach solution (e.g., dilute bleach with 1 part bleach and 10 parts water) or a household disinfectant with a label that says EPA-registered for coronaviruses. To make a bleach   solution at home, add 1 tablespoon of bleach to 1 quart (4 cups) of water. For a larger supply, add  cup of bleach to 1 gallon (16 cups) of water. Read labels of cleaning products and follow recommendations provided on product labels. Labels contain instructions for safe and effective use of the cleaning product including precautions you should take when applying the product, such as wearing gloves or eye protection and making sure you have good ventilation  during use of the product. Remove gloves and wash hands immediately after cleaning.  Monitor yourself for signs and symptoms of illness Caregivers and household members are considered close contacts, should monitor their health, and will be asked to limit movement outside of the home to the extent possible. Follow the monitoring steps for close contacts listed on the symptom monitoring form.   ? If you have additional questions, contact your local health department or call the epidemiologist on call at [phone] (available 24/7). ? This guidance is subject to change. For the most up-to-date guidance from [facility], please refer to their website: YouBlogs.pl

## 2019-04-22 NOTE — ED Triage Notes (Signed)
Pt c/o bodyaches  And fever at home 99.9. x 1 day, family at home with same

## 2019-04-22 NOTE — ED Provider Notes (Signed)
[facility] EMERGENCY DEPARTMENT Provider Note   CSN: [id number] Arrival date & time: 04/22/19  1939     History   Chief Complaint Chief Complaint  Patient presents with  . Generalized Body Aches    HPI Wendy Morton is a 28 y.o. female.     28 yo F with a chief complaint of feeling mildly unwell.  Patient denies any specific symptoms.  She has a mild cough that she attributes to her asthma.  Has had some mild abdominal discomfort that she thinks is because she is nervous.  States that her roomate has a fever and was exposed to someone that has the normal coronavirus.  She is concerned that she may have contracted it as well and would like tests performed.  The history is provided by the patient.  Illness Severity:  Mild Onset quality:  Gradual Duration:  2 days Timing:  Constant Progression:  Unchanged Chronicity:  New Associated symptoms: abdominal pain and cough   Associated symptoms: no chest pain, no congestion, no diarrhea, no fever, no headaches, no myalgias, no nausea, no rhinorrhea, no shortness of breath, no vomiting and no wheezing     Past Medical History:  Diagnosis Date  . Asthma   . Strep throat     Patient Active Problem List   Diagnosis Date Noted  . Extrinsic asthma 10/10/2015    History reviewed. No pertinent surgical history.   OB History   No obstetric history on file.      Home Medications    Prior to Admission medications   Medication Sig Start Date End Date Taking? Authorizing Provider  albuterol (PROVENTIL HFA;VENTOLIN HFA) 108 (90 Base) MCG/ACT inhaler Inhale 1-2 puffs into the lungs every 6 (six) hours as needed for wheezing or shortness of breath. 04/26/18   Wieters, Hallie C, PA-C  azithromycin (ZITHROMAX) 250 MG tablet Take 1 tablet (250 mg total) by mouth daily. Take first 2 tablets together, then 1 every day until finished. Patient not taking: Reported on 06/04/2018 04/26/18   Wieters, Hallie C, PA-C  cetirizine  (ZYRTEC) 10 MG tablet Take 10 mg by mouth daily.    [provider]  Cetirizine HCl 10 MG CAPS Take 1 capsule (10 mg total) by mouth daily for 10 days. Patient not taking: Reported on 06/04/2018 04/26/18 06/04/18  Wieters, Hallie C, PA-C  cyclobenzaprine (FLEXERIL) 10 MG tablet Take 1 tablet (10 mg total) by mouth 3 (three) times daily as needed for muscle spasms. 06/04/18   Lawyer, Cristal Deer, PA-C  EPINEPHrine (EPIPEN 2-PAK) 0.3 mg/0.3 mL IJ SOAJ injection Inject 0.3 mLs (0.3 mg total) into the muscle once as needed (for severe allergic reaction). CAll 911 immediately if you have to use this medicine 08/13/14   Muthersbaugh, Dahlia Client, PA-C  fluticasone (FLONASE) 50 MCG/ACT nasal spray Place 1-2 sprays into both nostrils daily for 7 days. Patient taking differently: Place 2 sprays into both nostrils daily as needed for allergies.  04/26/18 06/04/18  Wieters, Hallie C, PA-C  ibuprofen (ADVIL,MOTRIN) 800 MG tablet Take 1 tablet (800 mg total) by mouth every 6 (six) hours as needed. 06/04/18   Lawyer, Cristal Deer, PA-C  traMADol (ULTRAM) 50 MG tablet Take 1 tablet (50 mg total) by mouth every 6 (six) hours as needed for severe pain. 06/04/18   Charlestine Night, PA-C    Family History Family History  Problem Relation Age of Onset  . Cancer Mother     Social History Social History   Tobacco Use  .  Smoking status: Current Some Day Smoker  . Smokeless tobacco: Never Used  Substance Use Topics  . Alcohol use: Yes    Alcohol/week: 0.0 standard drinks    Comment: 3 times a month   . Drug use: No     Allergies   Tomato   Review of Systems Review of Systems  Constitutional: Negative for chills and fever.  HENT: Negative for congestion and rhinorrhea.   Eyes: Negative for redness and visual disturbance.  Respiratory: Positive for cough. Negative for shortness of breath and wheezing.   Cardiovascular: Negative for chest pain and palpitations.  Gastrointestinal: Positive for  abdominal pain. Negative for constipation, diarrhea, nausea and vomiting.  Genitourinary: Negative for dysuria and urgency.  Musculoskeletal: Negative for arthralgias and myalgias.  Skin: Negative for pallor and wound.  Neurological: Positive for weakness. Negative for dizziness and headaches.     Physical Exam Updated Vital Signs BP (!) 105/92 (BP Location: Left Arm)   Pulse 96   Temp 98.8 F (37.1 C) (Oral)   Resp 16   Ht 5\' 6"  (1.676 m)   Wt 105 kg   LMP 03/23/2019   SpO2 99%   BMI 37.34 kg/m   Physical Exam Vitals signs and nursing note reviewed.  Constitutional:      General: She is not in acute distress.    Appearance: She is well-developed. She is not diaphoretic.  HENT:     Head: Normocephalic and atraumatic.  Eyes:     Pupils: Pupils are equal, round, and reactive to light.  Neck:     Musculoskeletal: Normal range of motion and neck supple.  Cardiovascular:     Rate and Rhythm: Normal rate and regular rhythm.     Heart sounds: No murmur. No friction rub. No gallop.   Pulmonary:     Effort: Pulmonary effort is normal.     Breath sounds: No wheezing or rales.  Abdominal:     General: There is no distension.     Palpations: Abdomen is soft.     Tenderness: There is no abdominal tenderness.  Musculoskeletal:        General: No tenderness.  Skin:    General: Skin is warm and dry.  Neurological:     Mental Status: She is alert and oriented to person, place, and time.  Psychiatric:        Behavior: Behavior normal.      ED Treatments / Results  Labs (all labs ordered are listed, but only abnormal results are displayed) Labs Reviewed  NOVEL CORONAVIRUS, NAA (HOSP ORDER, SEND-OUT TO REF LAB; TAT 18-24 HRS)    EKG None  Radiology No results found.  Procedures Procedures (including critical care time)  Medications Ordered in ED Medications - No data to display   Initial Impression / Assessment and Plan / ED Course  I have reviewed the triage  vital signs and the nursing notes.  Pertinent labs & imaging results that were available during my care of the patient were reviewed by me and considered in my medical decision making (see chart for details).        28 yo F with a chief complaints of feeling unwell and being exposed to her roommate who was recently exposed to the coronavirus.  Patient is well-appearing nontoxic.  She is 99% on room air.  She has no abdominal tenderness.  Clear lung sounds.  We will send an outpatient test.  PCP follow-up.  Leim Fabryia J Mercadel was evaluated in Emergency Department on  04/22/2019 for the symptoms described in the history of present illness. He/she was evaluated in the context of the global COVID-19 pandemic, which necessitated consideration that the patient might be at risk for infection with the SARS-CoV-2 virus that causes COVID-19. Institutional protocols and algorithms that pertain to the evaluation of patients at risk for COVID-19 are in a state of rapid change based on information released by regulatory bodies including the CDC and federal and state organizations. These policies and algorithms were followed during the patient's care in the ED.  10:19 PM:  I have discussed the diagnosis/risks/treatment options with the patient and believe the pt to be eligible for discharge home to follow-up with PCP. We also discussed returning to the ED immediately if new or worsening sx occur. We discussed the sx which are most concerning (e.g., sudden worsening pain, fever, inability to tolerate by mouth) that necessitate immediate return. Medications administered to the patient during their visit and any new prescriptions provided to the patient are listed below.  Medications given during this visit Medications - No data to display   The patient appears reasonably screen and/or stabilized for discharge and I doubt any other medical condition or other [facility] requiring further screening, evaluation, or treatment in the  ED at this time prior to discharge.    Final Clinical Impressions(s) / ED Diagnoses   Final diagnoses:  Exposure to viral disease    ED Discharge Orders    None       Deno Etienne, DO 04/22/19 2219

## 2019-04-24 LAB — NOVEL CORONAVIRUS, NAA (HOSP ORDER, SEND-OUT TO REF LAB; TAT 18-24 HRS): SARS-CoV-2, NAA: NOT DETECTED

## 2019-07-08 ENCOUNTER — Ambulatory Visit: Payer: BC Managed Care – PPO | Attending: Internal Medicine

## 2019-07-08 DIAGNOSIS — Z20822 Contact with and (suspected) exposure to covid-19: Secondary | ICD-10-CM

## 2019-07-13 LAB — NOVEL CORONAVIRUS, NAA: SARS-CoV-2, NAA: NOT DETECTED

## 2019-07-20 ENCOUNTER — Ambulatory Visit: Payer: BC Managed Care – PPO | Attending: Internal Medicine

## 2019-07-20 DIAGNOSIS — Z20822 Contact with and (suspected) exposure to covid-19: Secondary | ICD-10-CM

## 2019-07-21 LAB — NOVEL CORONAVIRUS, NAA: SARS-CoV-2, NAA: NOT DETECTED

## 2019-10-21 ENCOUNTER — Encounter (HOSPITAL_BASED_OUTPATIENT_CLINIC_OR_DEPARTMENT_OTHER): Payer: Self-pay | Admitting: Emergency Medicine

## 2019-10-21 ENCOUNTER — Other Ambulatory Visit: Payer: Self-pay

## 2019-10-21 ENCOUNTER — Emergency Department (HOSPITAL_BASED_OUTPATIENT_CLINIC_OR_DEPARTMENT_OTHER)
Admission: EM | Admit: 2019-10-21 | Discharge: 2019-10-21 | Disposition: A | Discharge status: Home / Self Care | Payer: BC Managed Care – PPO | Attending: Emergency Medicine | Admitting: Emergency Medicine

## 2019-10-21 ENCOUNTER — Emergency Department (HOSPITAL_BASED_OUTPATIENT_CLINIC_OR_DEPARTMENT_OTHER): Payer: BC Managed Care – PPO

## 2019-10-21 DIAGNOSIS — R109 Unspecified abdominal pain: Secondary | ICD-10-CM

## 2019-10-21 DIAGNOSIS — R1032 Left lower quadrant pain: Secondary | ICD-10-CM | POA: Insufficient documentation

## 2019-10-21 LAB — URINALYSIS, ROUTINE W REFLEX MICROSCOPIC
Bilirubin Urine: NEGATIVE
Glucose, UA: NEGATIVE mg/dL
Hgb urine dipstick: NEGATIVE
Ketones, ur: NEGATIVE mg/dL
Leukocytes,Ua: NEGATIVE
Nitrite: NEGATIVE
Protein, ur: NEGATIVE mg/dL
Specific Gravity, Urine: 1.02 (ref 1.005–1.030)
pH: 6 (ref 5.0–8.0)

## 2019-10-21 LAB — COMPREHENSIVE METABOLIC PANEL
ALT: 16 U/L (ref 0–44)
AST: 26 U/L (ref 15–41)
Albumin: 3.6 g/dL (ref 3.5–5.0)
Alkaline Phosphatase: 41 U/L (ref 38–126)
Anion gap: 9 (ref 5–15)
BUN: 16 mg/dL (ref 6–20)
CO2: 25 mmol/L (ref 22–32)
Calcium: 9.1 mg/dL (ref 8.9–10.3)
Chloride: 105 mmol/L (ref 98–111)
Creatinine, Ser: 0.56 mg/dL (ref 0.44–1.00)
GFR calc Af Amer: >60 mL/min (ref >60)
GFR calc non Af Amer: >60 mL/min (ref >60)
Glucose, Bld: 114 mg/dL — ABNORMAL HIGH (ref 70–99)
Potassium: 3.6 mmol/L (ref 3.5–5.1)
Sodium: 139 mmol/L (ref 135–145)
Total Bilirubin: 0.4 mg/dL (ref 0.3–1.2)
Total Protein: 6.8 g/dL (ref 6.5–8.1)

## 2019-10-21 LAB — CBC WITH DIFFERENTIAL/PLATELET
Abs Immature Granulocytes: 0.03 10*3/uL (ref 0.00–0.07)
Basophils Absolute: 0 10*3/uL (ref 0.0–0.1)
Basophils Relative: 1 %
Eosinophils Absolute: 0.1 10*3/uL (ref 0.0–0.5)
Eosinophils Relative: 1 %
HCT: 40.7 % (ref 36.0–46.0)
Hemoglobin: 13.1 g/dL (ref 12.0–15.0)
Immature Granulocytes: 0 %
Lymphocytes Relative: 16 %
Lymphs Abs: 1.2 10*3/uL (ref 0.7–4.0)
MCH: 27 pg (ref 26.0–34.0)
MCHC: 32.2 g/dL (ref 30.0–36.0)
MCV: 83.9 fL (ref 80.0–100.0)
Monocytes Absolute: 0.3 10*3/uL (ref 0.1–1.0)
Monocytes Relative: 5 %
Neutro Abs: 5.9 10*3/uL (ref 1.7–7.7)
Neutrophils Relative %: 77 %
Platelets: 204 10*3/uL (ref 150–400)
RBC: 4.85 MIL/uL (ref 3.87–5.11)
RDW: 14.2 % (ref 11.5–15.5)
WBC: 7.6 10*3/uL (ref 4.0–10.5)
nRBC: 0 % (ref 0.0–0.2)

## 2019-10-21 LAB — PREGNANCY, URINE: Preg Test, Ur: NEGATIVE

## 2019-10-21 MED ORDER — LIDOCAINE VISCOUS HCL 2 % MT SOLN
15.0000 mL | Freq: Once | OROMUCOSAL | Status: AC
  Administered 2019-10-21: 04:00:00 15 mL via ORAL
  Filled 2019-10-21: qty 15

## 2019-10-21 MED ORDER — ONDANSETRON HCL 4 MG/2ML IJ SOLN
4.0000 mg | Freq: Once | INTRAMUSCULAR | Status: AC
  Administered 2019-10-21: 02:00:00 4 mg via INTRAVENOUS
  Filled 2019-10-21: qty 2

## 2019-10-21 MED ORDER — FENTANYL CITRATE (PF) 100 MCG/2ML IJ SOLN
50.0000 ug | INTRAMUSCULAR | Status: AC | PRN
  Administered 2019-10-21 (×2): 50 ug via INTRAVENOUS
  Filled 2019-10-21 (×2): qty 2

## 2019-10-21 MED ORDER — KETOROLAC TROMETHAMINE 30 MG/ML IJ SOLN
30.0000 mg | Freq: Once | INTRAMUSCULAR | Status: AC
  Administered 2019-10-21: 30 mg via INTRAVENOUS
  Filled 2019-10-21: qty 1

## 2019-10-21 MED ORDER — DICYCLOMINE HCL 20 MG PO TABS: 20.0000 mg | ORAL_TABLET | Freq: Two times a day (BID) | ORAL | 0 refills | Status: AC

## 2019-10-21 MED ORDER — ALUM & MAG HYDROXIDE-SIMETH 200-200-20 MG/5ML PO SUSP
30.0000 mL | Freq: Once | ORAL | Status: AC
  Administered 2019-10-21: 04:00:00 30 mL via ORAL
  Filled 2019-10-21: qty 30

## 2019-10-21 NOTE — ED Triage Notes (Signed)
Pt states before she went to bed tonight she had some back pain  Pt states now she is having pain in her lower abdomen in the middle  Pt states the pain is shooting into her left side  Pt states states it hurts to urinate  Pt has nausea without vomiting

## 2019-10-21 NOTE — ED Provider Notes (Signed)
[facility] EMERGENCY DEPARTMENT Provider Note   CSN: [id number] Arrival date & time: 10/21/19  0125     History Chief Complaint  Patient presents with  . Abdominal Pain    Wendy Morton is a 29 y.o. female.  The history is provided by the patient.  Abdominal Pain Pain location:  Suprapubic Pain quality: cramping   Pain radiates to:  L flank Pain severity:  Severe Onset quality:  Sudden Timing:  Constant Progression:  Unchanged Chronicity:  New Context: not alcohol use, not awakening from sleep, not laxative use, not medication withdrawal, not previous surgeries, not recent illness, not recent sexual activity, not recent travel, not retching, not sick contacts, not suspicious food intake and not trauma   Relieved by:  Nothing Worsened by:  Nothing Ineffective treatments:  None tried Associated symptoms: no anorexia, no belching, no chest pain, no chills, no constipation, no cough, no diarrhea, no fatigue, no fever, no flatus, no hematemesis, no hematochezia, no hematuria, no melena, no nausea, no shortness of breath, no sore throat, no vaginal bleeding, no vaginal discharge and no vomiting   Risk factors: no alcohol abuse        Past Medical History:  Diagnosis Date  . Asthma   . Strep throat     Patient Active Problem List   Diagnosis Date Noted  . Extrinsic asthma 10/10/2015    History reviewed. No pertinent surgical history.   OB History   No obstetric history on file.     Family History  Problem Relation Age of Onset  . Cancer Mother     Social History   Tobacco Use  . Smoking status: Former Games developer  . Smokeless tobacco: Never Used  Substance Use Topics  . Alcohol use: Yes    Alcohol/week: 0.0 standard drinks    Comment: 3 times a month   . Drug use: No    Home Medications Prior to Admission medications   Medication Sig Start Date End Date Taking? Authorizing Provider  albuterol (PROVENTIL HFA;VENTOLIN HFA) 108 (90 Base) MCG/ACT  inhaler Inhale 1-2 puffs into the lungs every 6 (six) hours as needed for wheezing or shortness of breath. 04/26/18   Wieters, Hallie C, PA-C  azithromycin (ZITHROMAX) 250 MG tablet Take 1 tablet (250 mg total) by mouth daily. Take first 2 tablets together, then 1 every day until finished. Patient not taking: Reported on 06/04/2018 04/26/18   Wieters, Hallie C, PA-C  cetirizine (ZYRTEC) 10 MG tablet Take 10 mg by mouth daily.    [provider]  Cetirizine HCl 10 MG CAPS Take 1 capsule (10 mg total) by mouth daily for 10 days. Patient not taking: Reported on 06/04/2018 04/26/18 06/04/18  Wieters, Hallie C, PA-C  cyclobenzaprine (FLEXERIL) 10 MG tablet Take 1 tablet (10 mg total) by mouth 3 (three) times daily as needed for muscle spasms. 06/04/18   Lawyer, Cristal Deer, PA-C  EPINEPHrine (EPIPEN 2-PAK) 0.3 mg/0.3 mL IJ SOAJ injection Inject 0.3 mLs (0.3 mg total) into the muscle once as needed (for severe allergic reaction). CAll 911 immediately if you have to use this medicine 08/13/14   Muthersbaugh, Dahlia Client, PA-C  fluticasone (FLONASE) 50 MCG/ACT nasal spray Place 1-2 sprays into both nostrils daily for 7 days. Patient taking differently: Place 2 sprays into both nostrils daily as needed for allergies.  04/26/18 06/04/18  Wieters, Hallie C, PA-C  ibuprofen (ADVIL,MOTRIN) 800 MG tablet Take 1 tablet (800 mg total) by mouth every 6 (six) hours as needed. 06/04/18  Lawyer, Cristal Deer, PA-C  traMADol (ULTRAM) 50 MG tablet Take 1 tablet (50 mg total) by mouth every 6 (six) hours as needed for severe pain. 06/04/18   Lawyer, Cristal Deer, PA-C    Allergies    Tomato  Review of Systems   Review of Systems  Constitutional: Negative for chills, fatigue and fever.  HENT: Negative for sore throat.   Eyes: Negative for visual disturbance.  Respiratory: Negative for cough and shortness of breath.   Cardiovascular: Negative for chest pain.  Gastrointestinal: Positive for abdominal pain. Negative  for anorexia, constipation, diarrhea, flatus, hematemesis, hematochezia, melena, nausea and vomiting.  Genitourinary: Negative for hematuria, vaginal bleeding and vaginal discharge.  Neurological: Negative for dizziness.  Psychiatric/Behavioral: Negative for agitation.  All other systems reviewed and are negative.   Physical Exam Updated Vital Signs BP (!) 129/94 (BP Location: Right Arm)   Pulse 91   Temp 98.3 F (36.8 C) (Oral)   Resp (!) 26   Ht 5\' 7"  (1.702 m)   Wt 99.8 kg   LMP 10/19/2019 (Exact Date) Comment: (-)u ptrg//ac  SpO2 100%   BMI 34.46 kg/m   Physical Exam Vitals and nursing note reviewed.  Constitutional:      General: She is not in acute distress.    Appearance: Normal appearance.  HENT:     Head: Normocephalic and atraumatic.     Nose: Nose normal.  Eyes:     Conjunctiva/sclera: Conjunctivae normal.     Pupils: Pupils are equal, round, and reactive to light.  Cardiovascular:     Rate and Rhythm: Normal rate and regular rhythm.     Pulses: Normal pulses.     Heart sounds: Normal heart sounds.  Pulmonary:     Effort: Pulmonary effort is normal.     Breath sounds: Normal breath sounds.  Abdominal:     General: Abdomen is flat.     Tenderness: There is no abdominal tenderness. There is no guarding or rebound.     Hernia: No hernia is present.     Comments: Gassy in the lower abdomen  Musculoskeletal:        General: Normal range of motion.     Cervical back: Normal range of motion and neck supple.  Skin:    General: Skin is warm and dry.     Capillary Refill: Capillary refill takes less than 2 seconds.  Neurological:     General: No focal deficit present.     Mental Status: She is alert and oriented to person, place, and time.     Deep Tendon Reflexes: Reflexes normal.  Psychiatric:        Mood and Affect: Mood normal.        Behavior: Behavior normal.     ED Results / Procedures / Treatments   Labs (all labs ordered are listed, but only  abnormal results are displayed) Results for orders placed or performed during the hospital encounter of 10/21/19  Urinalysis, Routine w reflex microscopic  Result Value Ref Range   Color, Urine YELLOW YELLOW   APPearance CLEAR CLEAR   Specific Gravity, Urine 1.020 1.005 - 1.030   pH 6.0 5.0 - 8.0   Glucose, UA NEGATIVE NEGATIVE mg/dL   Hgb urine dipstick NEGATIVE NEGATIVE   Bilirubin Urine NEGATIVE NEGATIVE   Ketones, ur NEGATIVE NEGATIVE mg/dL   Protein, ur NEGATIVE NEGATIVE mg/dL   Nitrite NEGATIVE NEGATIVE   Leukocytes,Ua NEGATIVE NEGATIVE  Pregnancy, urine  Result Value Ref Range   Preg Test, Ur NEGATIVE  NEGATIVE  CBC with Differential  Result Value Ref Range   WBC 7.6 4.0 - 10.5 K/uL   RBC 4.85 3.87 - 5.11 MIL/uL   Hemoglobin 13.1 12.0 - 15.0 g/dL   HCT 40.7 36.0 - 46.0 %   MCV 83.9 80.0 - 100.0 fL   MCH 27.0 26.0 - 34.0 pg   MCHC 32.2 30.0 - 36.0 g/dL   RDW 14.2 11.5 - 15.5 %   Platelets 204 150 - 400 K/uL   nRBC 0.0 0.0 - 0.2 %   Neutrophils Relative % 77 %   Neutro Abs 5.9 1.7 - 7.7 K/uL   Lymphocytes Relative 16 %   Lymphs Abs 1.2 0.7 - 4.0 K/uL   Monocytes Relative 5 %   Monocytes Absolute 0.3 0.1 - 1.0 K/uL   Eosinophils Relative 1 %   Eosinophils Absolute 0.1 0.0 - 0.5 K/uL   Basophils Relative 1 %   Basophils Absolute 0.0 0.0 - 0.1 K/uL   Immature Granulocytes 0 %   Abs Immature Granulocytes 0.03 0.00 - 0.07 K/uL   CT Renal Stone Study  Result Date: 10/21/2019 CLINICAL DATA:  Back pain, lower left abdominal pain, dysuria EXAM: CT ABDOMEN AND PELVIS WITHOUT CONTRAST TECHNIQUE: Multidetector CT imaging of the abdomen and pelvis was performed following the standard protocol without IV contrast. COMPARISON:  None. FINDINGS: Lower chest: No acute pleural or parenchymal lung disease. Hepatobiliary: No focal liver abnormality is seen. No gallstones, gallbladder wall thickening, or biliary dilatation. Pancreas: Unremarkable. No pancreatic ductal dilatation or  surrounding inflammatory changes. Spleen: Normal in size without focal abnormality. Adrenals/Urinary Tract: No urinary tract calculi or obstructive uropathy within either kidney. The bladder is decompressed which limits the evaluation. Normal adrenal glands. Stomach/Bowel: No bowel obstruction or ileus. Normal appendix right lower quadrant. Moderate retained stool throughout the colon. No bowel wall thickening or inflammatory changes. Vascular/Lymphatic: No significant vascular findings are present. No enlarged abdominal or pelvic lymph nodes. Reproductive: Uterus and bilateral adnexa are unremarkable. Other: No abdominal wall hernia or abnormality. No abdominopelvic ascites. Musculoskeletal: No acute or destructive bony lesions. Reconstructed images demonstrate no additional findings. IMPRESSION: 1. No urinary tract calculi or obstructive uropathy. 2. Unremarkable unenhanced exam. Electronically Signed   By: Randa Ngo M.D.   On: 10/21/2019 02:50    Radiology CT Renal Stone Study  Result Date: 10/21/2019 CLINICAL DATA:  Back pain, lower left abdominal pain, dysuria EXAM: CT ABDOMEN AND PELVIS WITHOUT CONTRAST TECHNIQUE: Multidetector CT imaging of the abdomen and pelvis was performed following the standard protocol without IV contrast. COMPARISON:  None. FINDINGS: Lower chest: No acute pleural or parenchymal lung disease. Hepatobiliary: No focal liver abnormality is seen. No gallstones, gallbladder wall thickening, or biliary dilatation. Pancreas: Unremarkable. No pancreatic ductal dilatation or surrounding inflammatory changes. Spleen: Normal in size without focal abnormality. Adrenals/Urinary Tract: No urinary tract calculi or obstructive uropathy within either kidney. The bladder is decompressed which limits the evaluation. Normal adrenal glands. Stomach/Bowel: No bowel obstruction or ileus. Normal appendix right lower quadrant. Moderate retained stool throughout the colon. No bowel wall thickening or  inflammatory changes. Vascular/Lymphatic: No significant vascular findings are present. No enlarged abdominal or pelvic lymph nodes. Reproductive: Uterus and bilateral adnexa are unremarkable. Other: No abdominal wall hernia or abnormality. No abdominopelvic ascites. Musculoskeletal: No acute or destructive bony lesions. Reconstructed images demonstrate no additional findings. IMPRESSION: 1. No urinary tract calculi or obstructive uropathy. 2. Unremarkable unenhanced exam. Electronically Signed   By: Randa Ngo M.D.   On:  10/21/2019 02:50    Procedures Procedures (including critical care time)  Medications Ordered in ED Medications  fentaNYL (SUBLIMAZE) injection 50 mcg (50 mcg Intravenous Given 10/21/19 0231)  ondansetron (ZOFRAN) injection 4 mg (4 mg Intravenous Given 10/21/19 0229)  ketorolac (TORADOL) 30 MG/ML injection 30 mg (30 mg Intravenous Given 10/21/19 0353)  alum & mag hydroxide-simeth (MAALOX/MYLANTA) 200-200-20 MG/5ML suspension 30 mL (30 mLs Oral Given 10/21/19 0353)    And  lidocaine (XYLOCAINE) 2 % viscous mouth solution 15 mL (15 mLs Oral Given 10/21/19 0353)    ED Course  I have reviewed the triage vital signs and the nursing notes.  Pertinent labs & imaging results that were available during my care of the patient were reviewed by me and considered in my medical decision making (see chart for details).    No signs of acute intraabdominal abnormality.  Likely cramping and gas pain.  Have treated in the ED and will discharge on PO bentyl and bland diet.    Wendy Morton was evaluated in Emergency Department on 10/21/2019 for the symptoms described in the history of present illness. She was evaluated in the context of the global COVID-19 pandemic, which necessitated consideration that the patient might be at risk for infection with the SARS-CoV-2 virus that causes COVID-19. Institutional protocols and algorithms that pertain to the evaluation of patients at risk for COVID-19 are  in a state of rapid change based on information released by regulatory bodies including the CDC and federal and state organizations. These policies and algorithms were followed during the patient's care in the ED.  Final Clinical Impression(s) / ED Diagnoses Return for weakness, numbness, changes in vision or speech, fevers >100.4 unrelieved by medication, shortness of breath, intractable vomiting, or diarrhea, abdominal pain, Inability to tolerate liquids or food, cough, altered mental status or any concerns. No signs of systemic illness or infection. The patient is nontoxic-appearing on exam and vital signs are within normal limits.   I have reviewed the triage vital signs and the nursing notes. Pertinent labs &imaging results that were available during my care of the patient were reviewed by me and considered in my medical decision making (see chart for details).  After history, exam, and medical workup I feel the patient has been appropriately medically screened and is safe for discharge home. Pertinent diagnoses were discussed with the patient. Patient was givenstrictreturn precautions.   Ofilia Rayon, MD 10/21/19 [phone]

## 2019-10-21 NOTE — ED Notes (Signed)
Returned from CT.

## 2019-10-21 NOTE — ED Notes (Signed)
Transported to CT 

## 2020-06-01 ENCOUNTER — Other Ambulatory Visit: Payer: Self-pay | Admitting: Nurse Practitioner

## 2020-06-01 DIAGNOSIS — N644 Mastodynia: Secondary | ICD-10-CM

## 2020-06-16 ENCOUNTER — Ambulatory Visit: Payer: BC Managed Care – PPO

## 2020-06-16 ENCOUNTER — Ambulatory Visit
Admission: RE | Admit: 2020-06-16 | Discharge: 2020-06-16 | Disposition: A | Discharge status: Home / Self Care | Payer: BC Managed Care – PPO | Source: Ambulatory Visit | Attending: Nurse Practitioner | Admitting: Nurse Practitioner

## 2020-06-16 ENCOUNTER — Other Ambulatory Visit: Payer: Self-pay

## 2020-06-16 DIAGNOSIS — N644 Mastodynia: Secondary | ICD-10-CM

## 2020-09-25 ENCOUNTER — Emergency Department (HOSPITAL_BASED_OUTPATIENT_CLINIC_OR_DEPARTMENT_OTHER)
Admission: EM | Admit: 2020-09-25 | Discharge: 2020-09-26 | Disposition: A | Discharge status: Home / Self Care | Payer: BC Managed Care – PPO | Attending: Emergency Medicine | Admitting: Emergency Medicine

## 2020-09-25 ENCOUNTER — Encounter (HOSPITAL_BASED_OUTPATIENT_CLINIC_OR_DEPARTMENT_OTHER): Payer: Self-pay | Admitting: Emergency Medicine

## 2020-09-25 ENCOUNTER — Other Ambulatory Visit: Payer: Self-pay

## 2020-09-25 DIAGNOSIS — J4521 Mild intermittent asthma with (acute) exacerbation: Secondary | ICD-10-CM | POA: Diagnosis not present

## 2020-09-25 DIAGNOSIS — R0982 Postnasal drip: Secondary | ICD-10-CM | POA: Insufficient documentation

## 2020-09-25 DIAGNOSIS — R059 Cough, unspecified: Secondary | ICD-10-CM

## 2020-09-25 DIAGNOSIS — Z87891 Personal history of nicotine dependence: Secondary | ICD-10-CM | POA: Diagnosis not present

## 2020-09-25 MED ORDER — AEROCHAMBER PLUS FLO-VU LARGE MISC
1.0000 | Freq: Once | Status: DC
  Filled 2020-09-25: qty 1

## 2020-09-25 MED ORDER — ALBUTEROL SULFATE HFA 108 (90 BASE) MCG/ACT IN AERS
6.0000 | INHALATION_SPRAY | Freq: Once | RESPIRATORY_TRACT | Status: AC
  Administered 2020-09-25: 6 via RESPIRATORY_TRACT
  Filled 2020-09-25: qty 6.7

## 2020-09-25 MED ORDER — BENZONATATE 100 MG PO CAPS
100.0000 mg | ORAL_CAPSULE | Freq: Once | ORAL | Status: AC
  Administered 2020-09-25: 100 mg via ORAL
  Filled 2020-09-25: qty 1

## 2020-09-25 MED ORDER — IPRATROPIUM BROMIDE HFA 17 MCG/ACT IN AERS
2.0000 | INHALATION_SPRAY | Freq: Once | RESPIRATORY_TRACT | Status: AC
  Administered 2020-09-25: 2 via RESPIRATORY_TRACT
  Filled 2020-09-25: qty 12.9

## 2020-09-25 NOTE — ED Notes (Signed)
ED Provider at bedside. 

## 2020-09-25 NOTE — ED Triage Notes (Signed)
Pt reports getting covid booster on Thursday, then started having cough and congestion. Hx asthma

## 2020-09-26 MED ORDER — DEXAMETHASONE 6 MG PO TABS
10.0000 mg | ORAL_TABLET | Freq: Once | ORAL | Status: AC
  Administered 2020-09-26: 10 mg via ORAL
  Filled 2020-09-26: qty 1

## 2020-09-26 MED ORDER — BENZONATATE 100 MG PO CAPS: 100.0000 mg | ORAL_CAPSULE | Freq: Three times a day (TID) | ORAL | 0 refills | Status: AC

## 2020-09-26 MED ORDER — DEXAMETHASONE 20 MG PO TABS: 10.0000 mg | ORAL_TABLET | ORAL | 0 refills | Status: AC

## 2020-09-26 NOTE — Discharge Instructions (Addendum)
Albuterol: 4 puffs every 4 hours on Monday. Afterwards, 2-4 puff every 4-6 hours. Atrovent: 2 puffs every 12 hours on Monday. Afterwards 1-2 daily if no improvement with albuterol.

## 2020-09-26 NOTE — ED Provider Notes (Signed)
[facility] EMERGENCY DEPARTMENT Provider Note  CSN: [id number] Arrival date & time: 09/25/20 2227  Chief Complaint(s) Cough (After covid vacccine)  HPI Wendy Morton is a 30 y.o. female with a history of asthma here for persistent cough and worsening shortness of breath for 2 days. Symptoms began 1 day after getting the Covid booster shot. Reports that initially her symptoms would improve with use of albuterol inhaler but tonight her albuterol did not help. She has associated congestion and postnasal drip. No nausea or vomiting.  HPI  Past Medical History Past Medical History:  Diagnosis Date  . Asthma   . Strep throat    Patient Active Problem List   Diagnosis Date Noted  . Extrinsic asthma 10/10/2015   Home Medication(s) Prior to Admission medications   Medication Sig Start Date End Date Taking? Authorizing Provider  benzonatate (TESSALON) 100 MG capsule Take 1 capsule (100 mg total) by mouth every 8 (eight) hours. 09/26/20  Yes Jereme Loren, Amadeo Garnet, MD  dexamethasone 20 MG TABS Take 10 mg by mouth every other day for 2 doses. 09/26/20 09/29/20 Yes Omarri Eich, Amadeo Garnet, MD  albuterol (PROVENTIL HFA;VENTOLIN HFA) 108 (90 Base) MCG/ACT inhaler Inhale 1-2 puffs into the lungs every 6 (six) hours as needed for wheezing or shortness of breath. 04/26/18   Wieters, Hallie C, PA-C  azithromycin (ZITHROMAX) 250 MG tablet Take 1 tablet (250 mg total) by mouth daily. Take first 2 tablets together, then 1 every day until finished. Patient not taking: Reported on 06/04/2018 04/26/18   Wieters, Hallie C, PA-C  cetirizine (ZYRTEC) 10 MG tablet Take 10 mg by mouth daily.    [provider]  Cetirizine HCl 10 MG CAPS Take 1 capsule (10 mg total) by mouth daily for 10 days. Patient not taking: Reported on 06/04/2018 04/26/18 06/04/18  Wieters, Hallie C, PA-C  cyclobenzaprine (FLEXERIL) 10 MG tablet Take 1 tablet (10 mg total) by mouth 3 (three) times daily as needed for  muscle spasms. 06/04/18   Lawyer, Cristal Deer, PA-C  dicyclomine (BENTYL) 20 MG tablet Take 1 tablet (20 mg total) by mouth 2 (two) times daily. 10/21/19   Palumbo, April, MD  EPINEPHrine (EPIPEN 2-PAK) 0.3 mg/0.3 mL IJ SOAJ injection Inject 0.3 mLs (0.3 mg total) into the muscle once as needed (for severe allergic reaction). CAll 911 immediately if you have to use this medicine 08/13/14   Muthersbaugh, Dahlia Client, PA-C  fluticasone (FLONASE) 50 MCG/ACT nasal spray Place 1-2 sprays into both nostrils daily for 7 days. Patient taking differently: Place 2 sprays into both nostrils daily as needed for allergies.  04/26/18 06/04/18  Wieters, Hallie C, PA-C  ibuprofen (ADVIL,MOTRIN) 800 MG tablet Take 1 tablet (800 mg total) by mouth every 6 (six) hours as needed. 06/04/18   Lawyer, Cristal Deer, PA-C  traMADol (ULTRAM) 50 MG tablet Take 1 tablet (50 mg total) by mouth every 6 (six) hours as needed for severe pain. 06/04/18   Charlestine Night, PA-C  Past Surgical History History reviewed. No pertinent surgical history. Family History Family History  Problem Relation Age of Onset  . Cancer Mother   . Breast cancer Mother     Social History Social History   Tobacco Use  . Smoking status: Former Games developermoker  . Smokeless tobacco: Never Used  Vaping Use  . Vaping Use: Never used  Substance Use Topics  . Alcohol use: Yes    Alcohol/week: 0.0 standard drinks    Comment: 3 times a month   . Drug use: No   Allergies Tomato  Review of Systems Review of Systems All other systems are reviewed and are negative for acute change except as noted in the HPI  Physical Exam Vital Signs  I have reviewed the triage vital signs BP 117/89   Pulse 91   Temp 98.9 F (37.2 C) (Oral)   Resp 20   LMP 09/25/2020   SpO2 100%   Physical Exam Vitals reviewed.  Constitutional:       General: She is not in acute distress.    Appearance: She is well-developed. She is not diaphoretic.  HENT:     Head: Normocephalic and atraumatic.     Comments:      Nose: Nose normal.     Mouth/Throat:     Comments: Post nasal drip with cobblestoning Eyes:     General: No scleral icterus.       Right eye: No discharge.        Left eye: No discharge.     Conjunctiva/sclera: Conjunctivae normal.     Pupils: Pupils are equal, round, and reactive to light.  Cardiovascular:     Rate and Rhythm: Normal rate and regular rhythm.     Heart sounds: No murmur heard. No friction rub. No gallop.   Pulmonary:     Effort: Pulmonary effort is normal. No respiratory distress.     Breath sounds: No stridor. Examination of the right-lower field reveals wheezing. Examination of the left-lower field reveals wheezing. Wheezing (faint) present. No rales.  Abdominal:     General: There is no distension.     Palpations: Abdomen is soft.     Tenderness: There is no abdominal tenderness.  Musculoskeletal:        General: No tenderness.     Cervical back: Normal range of motion and neck supple.  Skin:    General: Skin is warm and dry.     Findings: No erythema or rash.  Neurological:     Mental Status: She is alert and oriented to person, place, and time.     ED Results and Treatments Labs (all labs ordered are listed, but only abnormal results are displayed) Labs Reviewed - No data to display                                                                                                                       EKG  EKG Interpretation  Date/Time:    Ventricular Rate:  PR Interval:    QRS Duration:   QT Interval:    QTC Calculation:   R Axis:     Text Interpretation:        Radiology No results found.  Pertinent labs & imaging results that were available during my care of the patient were reviewed by me and considered in my medical decision making (see chart for  details).  Medications Ordered in ED Medications  AeroChamber Plus Flo-Vu Large MISC 1 each (has no administration in time range)  benzonatate (TESSALON) capsule 100 mg (100 mg Oral Given 09/25/20 2352)  albuterol (VENTOLIN HFA) 108 (90 Base) MCG/ACT inhaler 6 puff (6 puffs Inhalation Given 09/25/20 2359)  ipratropium (ATROVENT HFA) inhaler 2 puff (2 puffs Inhalation Given 09/25/20 2359)  dexamethasone (DECADRON) tablet 10 mg (10 mg Oral Given 09/26/20 0056)                                                                                                                                    Procedures Procedures  (including critical care time)  Medical Decision Making / ED Course I have reviewed the nursing notes for this encounter and the patient's prior records (if available in EHR or on provided paperwork).   Wendy Morton was evaluated in Emergency Department on 09/26/2020 for the symptoms described in the history of present illness. She was evaluated in the context of the global COVID-19 pandemic, which necessitated consideration that the patient might be at risk for infection with the SARS-CoV-2 virus that causes COVID-19. Institutional protocols and algorithms that pertain to the evaluation of patients at risk for COVID-19 are in a state of rapid change based on information released by regulatory bodies including the CDC and federal and state organizations. These policies and algorithms were followed during the patient's care in the ED.  Asthma exacerbation after COVID booster. Mild wheezing. PND on exam Treated with tessalon perle, albuterol/atrovent puffs, and decadron. Reported feeling much better afterward.  No need for CXR as this time.       Final Clinical Impression(s) / ED Diagnoses Final diagnoses:  Mild intermittent asthma with exacerbation  Cough    The patient appears reasonably screened and/or stabilized for discharge and I doubt any other medical condition or other  [facility] requiring further screening, evaluation, or treatment in the ED at this time prior to discharge. Safe for discharge with strict return precautions.  Disposition: Discharge  Condition: Good  I have discussed the results, Dx and Tx plan with the patient/family who expressed understanding and agree(s) with the plan. Discharge instructions discussed at length. The patient/family was given strict return precautions who verbalized understanding of the instructions. No further questions at time of discharge.    ED Discharge Orders         Ordered    dexamethasone 20 MG TABS  Every other day        09/26/20 0047  benzonatate (TESSALON) 100 MG capsule  Every 8 hours        09/26/20 0047            Follow Up: Courtney Paris, NP [address] [place] [address] [phone]  Call  as needed     This chart was dictated using voice recognition software.  Despite best efforts to proofread,  errors can occur which can change the documentation meaning.   Nira Conn, MD 09/26/20 [phone]

## 2021-07-26 ENCOUNTER — Other Ambulatory Visit: Payer: Self-pay | Admitting: Nurse Practitioner

## 2021-07-26 DIAGNOSIS — Z1231 Encounter for screening mammogram for malignant neoplasm of breast: Secondary | ICD-10-CM

## 2021-09-13 ENCOUNTER — Other Ambulatory Visit: Payer: Self-pay | Admitting: Nurse Practitioner

## 2021-09-13 DIAGNOSIS — N644 Mastodynia: Secondary | ICD-10-CM

## 2021-10-02 ENCOUNTER — Other Ambulatory Visit: Payer: BC Managed Care – PPO

## 2021-10-04 ENCOUNTER — Ambulatory Visit: Admission: RE | Admit: 2021-10-04 | Payer: BC Managed Care – PPO | Source: Ambulatory Visit

## 2021-10-04 ENCOUNTER — Ambulatory Visit: Payer: BC Managed Care – PPO

## 2021-10-04 ENCOUNTER — Ambulatory Visit
Admission: RE | Admit: 2021-10-04 | Discharge: 2021-10-04 | Disposition: A | Discharge status: Home / Self Care | Payer: BC Managed Care – PPO | Source: Ambulatory Visit | Attending: Nurse Practitioner | Admitting: Nurse Practitioner

## 2021-10-04 DIAGNOSIS — N644 Mastodynia: Secondary | ICD-10-CM

## 2022-09-15 IMAGING — MG DIGITAL DIAGNOSTIC BILAT W/ TOMO W/ CAD
6 of 12 series · 6 of 36 positions shown · non-contrast
Comparison: Previous exam(s).

CLINICAL DATA: 31-year-old with nonfocal outer LEFT breast pain
over the past 6 months which has recently improved. Annual
evaluation, RIGHT breast.

Family history of breast cancer in her mother originally diagnosed
at age 36 with recurrence at age 37 and age 43.
EXAM:
DIGITAL DIAGNOSTIC BILATERAL MAMMOGRAM WITH TOMOSYNTHESIS AND CAD
TECHNIQUE: Bilateral digital diagnostic mammography and breast tomosynthesis
was performed. The images were evaluated with computer-aided
detection.

[R MLO synth-2D]
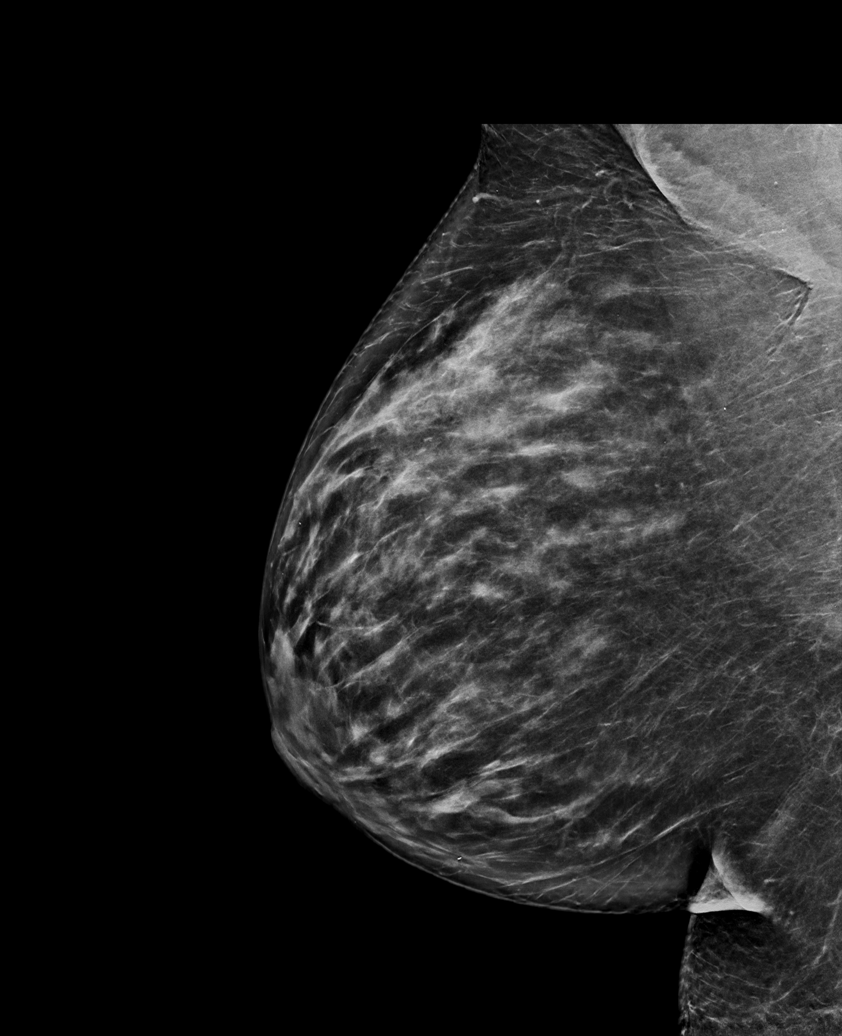

[L CC synth-2D (1 of 2)]
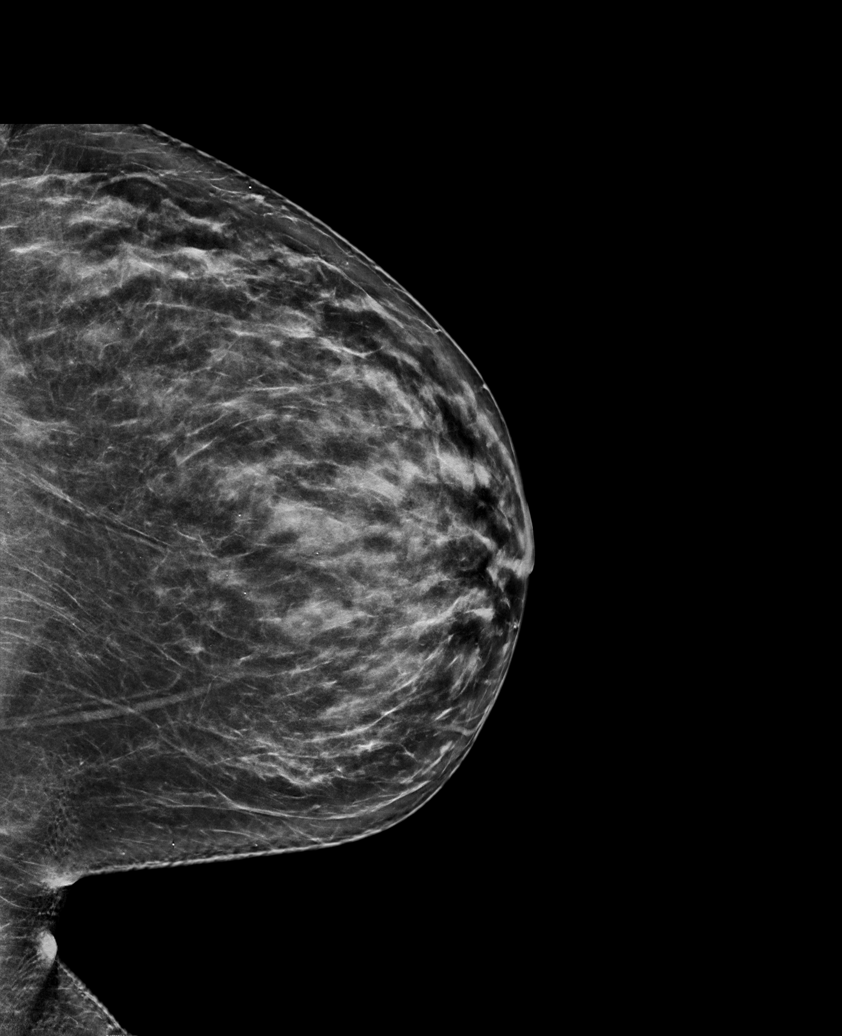

[L MLO synth-2D (1 of 2)]
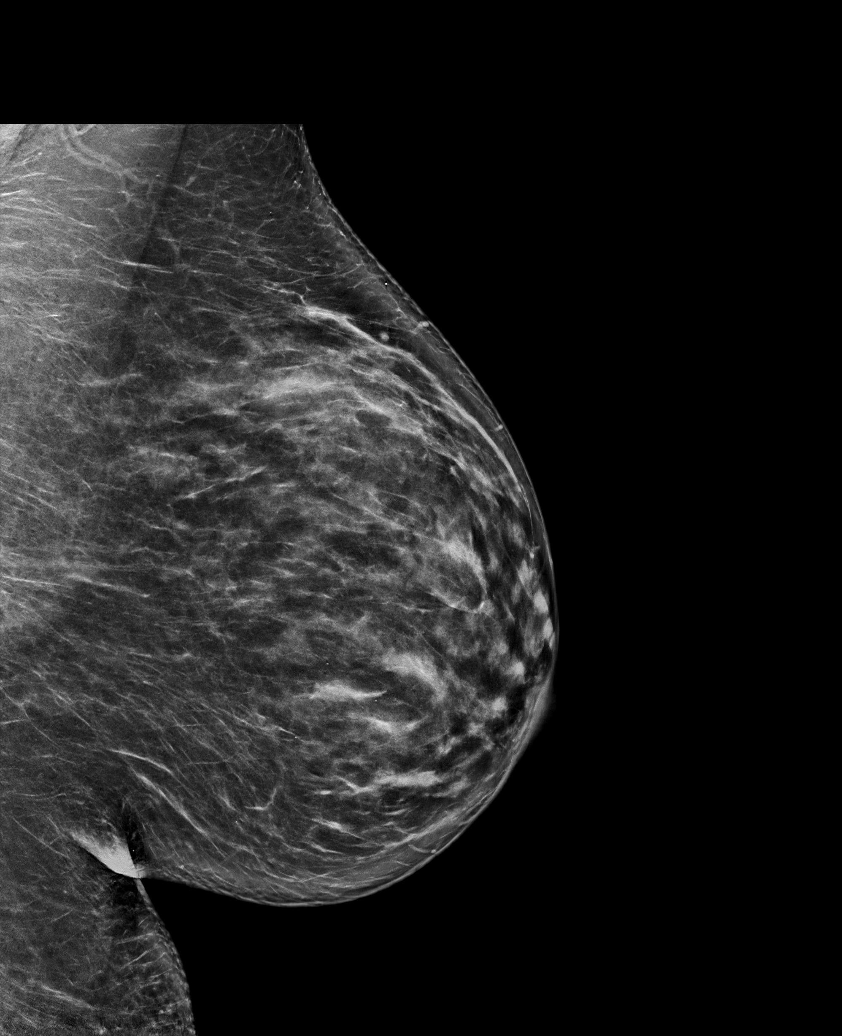

[L CC synth-2D (2 of 2)]
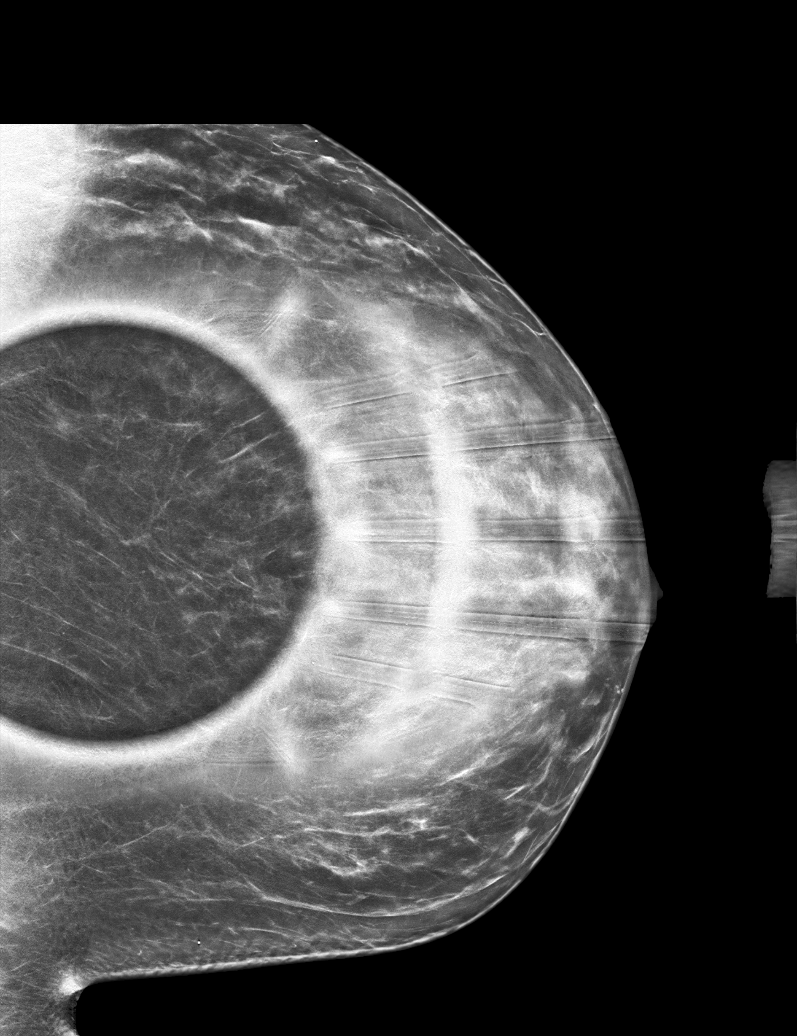

[R CC synth-2D]
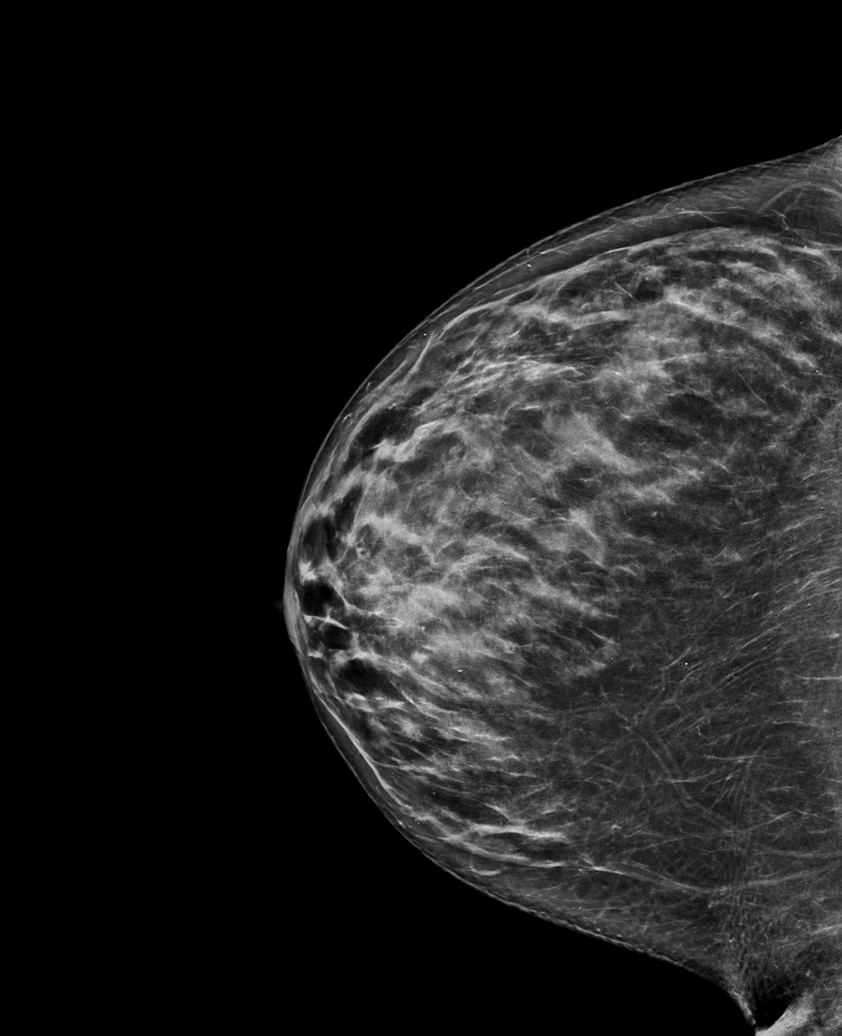

[L MLO synth-2D (2 of 2)]
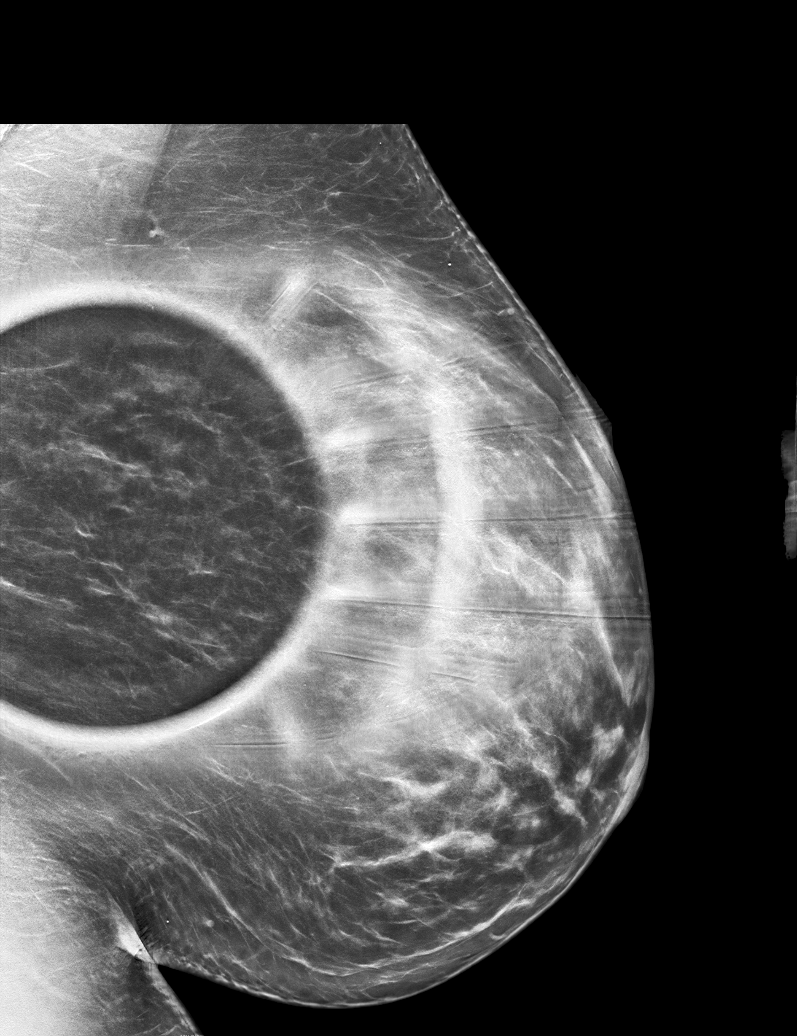

[6 of 36 positions shown; findings below may reference images not displayed]

ACR Breast Density Category c: The breast tissue is heterogeneously
dense, which may obscure small masses.
FINDINGS: Full field CC and MLO views of both breasts and spot compression CC
and MLO views of a mammographic finding in the LEFT breast were
obtained.

RIGHT: No findings suspicious for malignancy.

LEFT: No findings suspicious for malignancy. An asymmetry in the
lower outer breast at far posterior depth disperses with compression
and therefore represents an island of fibroglandular tissue.
IMPRESSION: No mammographic evidence of malignancy involving either breast.

RECOMMENDATION:
Since the patient's mother had breast cancer at age 36, the patient
should begin annual screening mammography. Therefore, screening
mammography in 1 year is recommended.

I have discussed the findings and recommendations with the patient.
Strategies for alleviating breast pain including decreasing caffeine
intake, vitamin-E supplementation, and the use of topical lidocaine
gel or a nonsteroidal anti-inflammatory gel were discussed with the
patient.

If applicable, a reminder letter will be sent to the patient
regarding the next appointment.

BI-RADS CATEGORY  1: Negative.

## 2022-10-27 ENCOUNTER — Other Ambulatory Visit: Payer: Self-pay

## 2022-10-27 ENCOUNTER — Emergency Department (HOSPITAL_BASED_OUTPATIENT_CLINIC_OR_DEPARTMENT_OTHER)
Admission: EM | Admit: 2022-10-27 | Discharge: 2022-10-27 | Disposition: A | Discharge status: Home / Self Care | Payer: BC Managed Care – PPO | Attending: Emergency Medicine | Admitting: Emergency Medicine

## 2022-10-27 ENCOUNTER — Encounter (HOSPITAL_BASED_OUTPATIENT_CLINIC_OR_DEPARTMENT_OTHER): Payer: Self-pay | Admitting: Emergency Medicine

## 2022-10-27 ENCOUNTER — Emergency Department (HOSPITAL_BASED_OUTPATIENT_CLINIC_OR_DEPARTMENT_OTHER): Payer: BC Managed Care – PPO

## 2022-10-27 DIAGNOSIS — R0781 Pleurodynia: Secondary | ICD-10-CM | POA: Insufficient documentation

## 2022-10-27 DIAGNOSIS — M79602 Pain in left arm: Secondary | ICD-10-CM

## 2022-10-27 DIAGNOSIS — M25512 Pain in left shoulder: Secondary | ICD-10-CM | POA: Diagnosis not present

## 2022-10-27 DIAGNOSIS — Y92410 Unspecified street and highway as the place of occurrence of the external cause: Secondary | ICD-10-CM | POA: Insufficient documentation

## 2022-10-27 MED ORDER — CYCLOBENZAPRINE HCL 10 MG PO TABS: 10.0000 mg | ORAL_TABLET | Freq: Two times a day (BID) | ORAL | 0 refills | Status: AC | PRN

## 2022-10-27 MED ORDER — OXYCODONE-ACETAMINOPHEN 5-325 MG PO TABS: 1.0000 | ORAL_TABLET | Freq: Four times a day (QID) | ORAL | 0 refills | Status: AC | PRN
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
## 2023-02-19 ENCOUNTER — Emergency Department (HOSPITAL_BASED_OUTPATIENT_CLINIC_OR_DEPARTMENT_OTHER)
Admission: EM | Admit: 2023-02-19 | Discharge: 2023-02-20 | Disposition: A | Discharge status: Home / Self Care | Payer: BC Managed Care – PPO | Attending: Emergency Medicine | Admitting: Emergency Medicine

## 2023-02-19 ENCOUNTER — Other Ambulatory Visit: Payer: Self-pay

## 2023-02-19 ENCOUNTER — Emergency Department (HOSPITAL_BASED_OUTPATIENT_CLINIC_OR_DEPARTMENT_OTHER): Payer: BC Managed Care – PPO | Admitting: Radiology

## 2023-02-19 DIAGNOSIS — R0789 Other chest pain: Secondary | ICD-10-CM | POA: Diagnosis present

## 2023-02-19 DIAGNOSIS — M79605 Pain in left leg: Secondary | ICD-10-CM | POA: Diagnosis not present

## 2023-02-19 DIAGNOSIS — J45909 Unspecified asthma, uncomplicated: Secondary | ICD-10-CM | POA: Diagnosis not present

## 2023-02-19 LAB — CBC
HCT: 38.9 % (ref 36.0–46.0)
Hemoglobin: 12.8 g/dL (ref 12.0–15.0)
MCH: 27.6 pg (ref 26.0–34.0)
MCHC: 32.9 g/dL (ref 30.0–36.0)
MCV: 84 fL (ref 80.0–100.0)
Platelets: 260 10*3/uL (ref 150–400)
RBC: 4.63 MIL/uL (ref 3.87–5.11)
RDW: 14.6 % (ref 11.5–15.5)
WBC: 9.3 10*3/uL (ref 4.0–10.5)
nRBC: 0 % (ref 0.0–0.2)

## 2023-02-19 LAB — BASIC METABOLIC PANEL
Anion gap: 8 (ref 5–15)
BUN: 12 mg/dL (ref 6–20)
CO2: 26 mmol/L (ref 22–32)
Calcium: 10 mg/dL (ref 8.9–10.3)
Chloride: 104 mmol/L (ref 98–111)
Creatinine, Ser: 0.98 mg/dL (ref 0.44–1.00)
GFR, Estimated: >60 mL/min (ref >60)
Glucose, Bld: 108 mg/dL — ABNORMAL HIGH (ref 70–99)
Potassium: 4.1 mmol/L (ref 3.5–5.1)
Sodium: 138 mmol/L (ref 135–145)

## 2023-02-19 LAB — D-DIMER, QUANTITATIVE: D-Dimer, Quant: 0.42 ug/mL-FEU (ref 0.00–0.50)

## 2023-02-19 LAB — TROPONIN I (HIGH SENSITIVITY)
Troponin I (High Sensitivity): <2 ng/L (ref <18)
Troponin I (High Sensitivity): <2 ng/L (ref <18)

## 2023-02-19 LAB — PREGNANCY, URINE: Preg Test, Ur: NEGATIVE

## 2023-02-19 MED ORDER — KETOROLAC TROMETHAMINE 30 MG/ML IJ SOLN
30.0000 mg | Freq: Once | INTRAMUSCULAR | Status: AC
  Administered 2023-02-19: 30 mg via INTRAMUSCULAR
  Filled 2023-02-19: qty 1

## 2023-02-19 NOTE — ED Provider Notes (Signed)
[place] EMERGENCY DEPARTMENT AT [facility] Provider Note   CSN: [id number] Arrival date & time: 02/19/23  1837     History {Add pertinent medical, surgical, social history, OB history to HPI:1} Chief Complaint  Patient presents with   Chest Pain    Wendy Morton is a 32 y.o. female.  HPI     This is a 32 year old female who presents with left-sided chest pain.  Patient reports she developed some left arm pain when she woke up from sleep several days ago.  She has had progressive pain despite Tylenol and ibuprofen.  She states that now she has pain in her left chest and her left leg.  The pain in her left chest is worse with deep breathing and movements.  Pain in her arm is also worse with movement.  Denies any injury or heavy lifting.  She has not had any fevers.  Home Medications Prior to Admission medications   Medication Sig Start Date End Date Taking? Authorizing Provider  albuterol (PROVENTIL HFA;VENTOLIN HFA) 108 (90 Base) MCG/ACT inhaler Inhale 1-2 puffs into the lungs every 6 (six) hours as needed for wheezing or shortness of breath. 04/26/18   Wieters, Hallie C, PA-C  azithromycin (ZITHROMAX) 250 MG tablet Take 1 tablet (250 mg total) by mouth daily. Take first 2 tablets together, then 1 every day until finished. Patient not taking: Reported on 06/04/2018 04/26/18   Wieters, Hallie C, PA-C  benzonatate (TESSALON) 100 MG capsule Take 1 capsule (100 mg total) by mouth every 8 (eight) hours. 09/26/20   Nira Conn, MD  cetirizine (ZYRTEC) 10 MG tablet Take 10 mg by mouth daily.    [provider]  Cetirizine HCl 10 MG CAPS Take 1 capsule (10 mg total) by mouth daily for 10 days. Patient not taking: Reported on 06/04/2018 04/26/18 06/04/18  Wieters, Hallie C, PA-C  cyclobenzaprine (FLEXERIL) 10 MG tablet Take 1 tablet (10 mg total) by mouth 2 (two) times daily as needed for muscle spasms. 10/27/22   Smitty Knudsen, PA-C  dicyclomine (BENTYL) 20 MG  tablet Take 1 tablet (20 mg total) by mouth 2 (two) times daily. 10/21/19   Palumbo, April, MD  EPINEPHrine (EPIPEN 2-PAK) 0.3 mg/0.3 mL IJ SOAJ injection Inject 0.3 mLs (0.3 mg total) into the muscle once as needed (for severe allergic reaction). CAll 911 immediately if you have to use this medicine 08/13/14   Muthersbaugh, Dahlia Client, PA-C  fluticasone (FLONASE) 50 MCG/ACT nasal spray Place 1-2 sprays into both nostrils daily for 7 days. Patient taking differently: Place 2 sprays into both nostrils daily as needed for allergies.  04/26/18 06/04/18  Wieters, Hallie C, PA-C  ibuprofen (ADVIL,MOTRIN) 800 MG tablet Take 1 tablet (800 mg total) by mouth every 6 (six) hours as needed. 06/04/18   Lawyer, Cristal Deer, PA-C  oxyCODONE-acetaminophen (PERCOCET/ROXICET) 5-325 MG tablet Take 1 tablet by mouth every 6 (six) hours as needed for severe pain. 10/27/22   Smitty Knudsen, PA-C  traMADol (ULTRAM) 50 MG tablet Take 1 tablet (50 mg total) by mouth every 6 (six) hours as needed for severe pain. 06/04/18   Lawyer, Cristal Deer, PA-C      Allergies    Tomato    Review of Systems   Review of Systems  Constitutional:  Negative for fever.  Respiratory:  Negative for shortness of breath.   Cardiovascular:  Positive for chest pain. Negative for palpitations and leg swelling.  Musculoskeletal:        Arm pain  All other systems reviewed and are negative.   Physical Exam Updated Vital Signs BP 92/76   Pulse 78   Temp 98.2 F (36.8 C) (Oral)   Resp 14   SpO2 100%  Physical Exam Vitals and nursing note reviewed.  Constitutional:      Appearance: She is well-developed. She is obese. She is not ill-appearing.  HENT:     Head: Normocephalic and atraumatic.  Eyes:     Pupils: Pupils are equal, round, and reactive to light.  Cardiovascular:     Rate and Rhythm: Normal rate and regular rhythm.     Heart sounds: Normal heart sounds.  Pulmonary:     Effort: Pulmonary effort is normal. No respiratory  distress.     Breath sounds: No wheezing.  Abdominal:     General: Bowel sounds are normal.     Palpations: Abdomen is soft.  Musculoskeletal:     Cervical back: Neck supple.     Comments: Normal range of motion left arm, no obvious deformities  Skin:    General: Skin is warm and dry.  Neurological:     Mental Status: She is alert and oriented to person, place, and time.  Psychiatric:        Mood and Affect: Mood normal.     ED Results / Procedures / Treatments   Labs (all labs ordered are listed, but only abnormal results are displayed) Labs Reviewed  BASIC METABOLIC PANEL - Abnormal; Notable for the following components:      Result Value   Glucose, Bld 108 (*)    All other components within normal limits  CBC  PREGNANCY, URINE  D-DIMER, QUANTITATIVE  TROPONIN I (HIGH SENSITIVITY)  TROPONIN I (HIGH SENSITIVITY)    EKG None  Radiology DG Chest 2 View  Result Date: 02/19/2023 CLINICAL DATA:  Chest pain for several days, initial encounter EXAM: CHEST - 2 VIEW COMPARISON:  10/27/2022 FINDINGS: The heart size and mediastinal contours are within normal limits. Both lungs are clear. The visualized skeletal structures are unremarkable. IMPRESSION: No active cardiopulmonary disease. Electronically Signed   By: Alcide Clever M.D.   On: 02/19/2023 19:16    Procedures Procedures  {Document cardiac monitor, telemetry assessment procedure when appropriate:1}  Medications Ordered in ED Medications  ketorolac (TORADOL) 30 MG/ML injection 30 mg (has no administration in time range)    ED Course/ Medical Decision Making/ A&P   {   Click here for ABCD2, HEART and other calculatorsREFRESH Note before signing :1}                              Medical Decision Making Amount and/or Complexity of Data Reviewed Labs: ordered. Radiology: ordered.  Risk Prescription drug management.   ***  {Document critical care time when appropriate:1} {Document review of labs and clinical  decision tools ie heart score, Chads2Vasc2 etc:1}  {Document your independent review of radiology images, and any outside records:1} {Document your discussion with family members, caretakers, and with consultants:1} {Document social determinants of health affecting pt's care:1} {Document your decision making why or why not admission, treatments were needed:1} Final Clinical Impression(s) / ED Diagnoses Final diagnoses:  None    Rx / DC Orders ED Discharge Orders     None

## 2023-02-19 NOTE — ED Triage Notes (Signed)
Pt reports Sunday left arm pain. Has had episodes of similar pain several times over past year. Has progressively become worse. Denies SOB, but endorses chest pressure. Denies numbness tingling.Seen by tele health for worsening arm pain, spreading to leg of same side. Also reports both eyes watering/crusted over. Started on steroid for eyes- has only taken one dose. Arm pain does not seem to be addressed.

## 2023-02-19 NOTE — ED Notes (Signed)
Lab called to add on D-dimer.

## 2023-02-20 MED ORDER — NAPROXEN 500 MG PO TABS: 500.0000 mg | ORAL_TABLET | Freq: Two times a day (BID) | ORAL | 0 refills | Status: AC

## 2023-02-20 NOTE — Discharge Instructions (Signed)
You were seen today for chest and arm pain.  Your workup is reassuring.  Trial naproxen to see if this helps with your symptoms.

## 2023-02-20 NOTE — ED Notes (Signed)
Pt verbalized understanding of d/c instructions, meds, and followup care. Denies questions. VSS, no distress noted. Steady gait to exit with all belongings. Sent home with script for naproxen.

## 2023-05-06 ENCOUNTER — Other Ambulatory Visit: Payer: Self-pay | Admitting: Nurse Practitioner

## 2023-05-06 DIAGNOSIS — Z1231 Encounter for screening mammogram for malignant neoplasm of breast: Secondary | ICD-10-CM

## 2023-05-29 ENCOUNTER — Ambulatory Visit: Payer: BC Managed Care – PPO

## 2023-07-05 ENCOUNTER — Ambulatory Visit
Admission: RE | Admit: 2023-07-05 | Discharge: 2023-07-05 | Disposition: A | Discharge status: Home / Self Care | Payer: BC Managed Care – PPO | Source: Ambulatory Visit | Attending: Nurse Practitioner | Admitting: Nurse Practitioner

## 2023-07-05 DIAGNOSIS — Z1231 Encounter for screening mammogram for malignant neoplasm of breast: Secondary | ICD-10-CM

## 2023-11-28 ENCOUNTER — Encounter (HOSPITAL_BASED_OUTPATIENT_CLINIC_OR_DEPARTMENT_OTHER): Payer: Self-pay | Admitting: Emergency Medicine

## 2023-11-28 ENCOUNTER — Other Ambulatory Visit: Payer: Self-pay

## 2023-11-28 ENCOUNTER — Emergency Department (HOSPITAL_BASED_OUTPATIENT_CLINIC_OR_DEPARTMENT_OTHER)

## 2023-11-28 ENCOUNTER — Emergency Department (HOSPITAL_BASED_OUTPATIENT_CLINIC_OR_DEPARTMENT_OTHER)
Admission: EM | Admit: 2023-11-28 | Discharge: 2023-11-28 | Disposition: A | Discharge status: Home / Self Care | Attending: Emergency Medicine | Admitting: Emergency Medicine

## 2023-11-28 DIAGNOSIS — Y92410 Unspecified street and highway as the place of occurrence of the external cause: Secondary | ICD-10-CM | POA: Insufficient documentation

## 2023-11-28 DIAGNOSIS — M25512 Pain in left shoulder: Secondary | ICD-10-CM | POA: Diagnosis present

## 2023-11-28 DIAGNOSIS — M5489 Other dorsalgia: Secondary | ICD-10-CM | POA: Insufficient documentation

## 2023-11-28 LAB — PREGNANCY, URINE: Preg Test, Ur: NEGATIVE

## 2023-11-28 MED ORDER — NAPROXEN 250 MG PO TABS
500.0000 mg | ORAL_TABLET | Freq: Once | ORAL | Status: AC
  Administered 2023-11-28: 500 mg via ORAL
  Filled 2023-11-28: qty 2

## 2023-11-28 MED ORDER — METHOCARBAMOL 500 MG PO TABS: 500.0000 mg | ORAL_TABLET | Freq: Two times a day (BID) | ORAL | 0 refills | Status: AC

## 2023-11-28 MED ORDER — NAPROXEN 500 MG PO TABS: 500.0000 mg | ORAL_TABLET | Freq: Two times a day (BID) | ORAL | 0 refills | Status: AC
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
## 2023-11-28 NOTE — Discharge Instructions (Addendum)
 Thank for let us  evaluate you today.  Your x-rays are negative for fracture.  I have provided you with naproxen  here in emergency department for pain.  Will send you home with naproxen  and muscle relaxer for symptoms.  You may use naproxen  and Tylenol  intermittently every 8 hours as needed for pain.  Please do not use naproxen  with aspirin, Aleve , ibuprofen , Advil  as they are all in the same family. Robaxin  may cause drowsiness so do not operate heavy machinery including driving or drink alcohol with this.  You may take this at night or split the tablet in half if it makes you too drowsy.  You may follow-up with primary care provider in 7-10 days if you continue have symptoms.  I would recommend resting, icing or heat areas for pain.
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
## 2023-11-28 NOTE — ED Notes (Signed)
